# Patient Record
Sex: Male | Born: 1980 | Race: Black or African American | Hispanic: No | Marital: Married | Smoking: Current some day smoker
Health system: Southern US, Community
[De-identification: ages and names within clinical notes are randomized; demographics above are authoritative.]

## PROBLEM LIST (undated history)

## (undated) DIAGNOSIS — I4891 Unspecified atrial fibrillation: Secondary | ICD-10-CM

## (undated) DIAGNOSIS — I10 Essential (primary) hypertension: Secondary | ICD-10-CM

## (undated) DIAGNOSIS — R600 Localized edema: Secondary | ICD-10-CM

## (undated) DIAGNOSIS — E669 Obesity, unspecified: Secondary | ICD-10-CM

## (undated) DIAGNOSIS — E8881 Metabolic syndrome: Secondary | ICD-10-CM

## (undated) DIAGNOSIS — J189 Pneumonia, unspecified organism: Secondary | ICD-10-CM

## (undated) DIAGNOSIS — G43909 Migraine, unspecified, not intractable, without status migrainosus: Secondary | ICD-10-CM

## (undated) DIAGNOSIS — G4730 Sleep apnea, unspecified: Secondary | ICD-10-CM

## (undated) HISTORY — DX: Migraine, unspecified, not intractable, without status migrainosus: G43.909

## (undated) HISTORY — DX: Sleep apnea, unspecified: G47.30

## (undated) HISTORY — DX: Metabolic syndrome: E88.81

## (undated) HISTORY — PX: KNEE SURGERY: SHX244

## (undated) HISTORY — DX: Obesity, unspecified: E66.9

## (undated) HISTORY — DX: Metabolic syndrome: E88.810

## (undated) HISTORY — DX: Localized edema: R60.0

---

## 2015-09-10 ENCOUNTER — Telehealth: Payer: Self-pay | Admitting: Cardiovascular Disease

## 2015-09-10 NOTE — Telephone Encounter (Signed)
Records received from [facility] for appointment with Dr Tresa Endo on 09/12/15.  Records given to [facility] (medical records) for Dr Landry Dyke schedule on 09/12/15. lp

## 2015-09-12 ENCOUNTER — Encounter: Payer: Self-pay | Admitting: Cardiovascular Disease

## 2015-09-12 ENCOUNTER — Ambulatory Visit (INDEPENDENT_AMBULATORY_CARE_PROVIDER_SITE_OTHER): Payer: BLUE CROSS/BLUE SHIELD | Admitting: Cardiovascular Disease

## 2015-09-12 VITALS — BP 132/90 | HR 84 | Ht 74.0 in | Wt >= 6400 oz

## 2015-09-12 DIAGNOSIS — R600 Localized edema: Secondary | ICD-10-CM

## 2015-09-12 DIAGNOSIS — R252 Cramp and spasm: Secondary | ICD-10-CM | POA: Diagnosis not present

## 2015-09-12 DIAGNOSIS — G4730 Sleep apnea, unspecified: Secondary | ICD-10-CM

## 2015-09-12 DIAGNOSIS — Z716 Tobacco abuse counseling: Secondary | ICD-10-CM

## 2015-09-12 DIAGNOSIS — M79606 Pain in leg, unspecified: Secondary | ICD-10-CM

## 2015-09-12 DIAGNOSIS — E8881 Metabolic syndrome: Secondary | ICD-10-CM

## 2015-09-12 DIAGNOSIS — G4733 Obstructive sleep apnea (adult) (pediatric): Secondary | ICD-10-CM

## 2015-09-12 DIAGNOSIS — R0600 Dyspnea, unspecified: Secondary | ICD-10-CM

## 2015-09-12 DIAGNOSIS — R0609 Other forms of dyspnea: Secondary | ICD-10-CM | POA: Diagnosis not present

## 2015-09-12 DIAGNOSIS — Z13220 Encounter for screening for lipoid disorders: Secondary | ICD-10-CM

## 2015-09-12 MED ORDER — FUROSEMIDE 80 MG PO TABS: 80.0000 mg | ORAL_TABLET | Freq: Every day | ORAL | Status: DC

## 2015-09-12 NOTE — Patient Instructions (Signed)
Your physician recommends that you return for lab work fasting.  Your physician has requested that you have a lower or upper extremity venous duplex. This test is an ultrasound of the veins in the legs or arms. It looks at venous blood flow that carries blood from the heart to the legs or arms. Allow one hour for a Lower Venous exam. Allow thirty minutes for an Upper Venous exam. There are no restrictions or special instructions.  Your physician has requested that you have a lower or upper extremity arterial duplex. This test is an ultrasound of the arteries in the legs or arms. It looks at arterial blood flow in the legs and arms. Allow one hour for Lower and Upper Arterial scans. There are no restrictions or special instructions  Your physician has recommended that you have a sleep study. This test records several body functions during sleep, including: brain activity, eye movement, oxygen and carbon dioxide blood levels, heart rate and rhythm, breathing rate and rhythm, the flow of air through your mouth and nose, snoring, body muscle movements, and chest and belly movement.   Your physician has recommended you make the following change in your medication: the furosemide has been increased from 40 mg to 80 mg daily.  Your physician has requested that you have an echocardiogram. Echocardiography is a painless test that uses sound waves to create images of your heart. It provides your doctor with information about the size and shape of your heart and how well your heart's chambers and valves are working. This procedure takes approximately one hour. There are no restrictions for this procedure.  Your physician recommends that you schedule a follow-up appointment in: 4 weeks with Dr Tresa Endo.

## 2015-09-14 ENCOUNTER — Other Ambulatory Visit: Payer: Self-pay | Admitting: Cardiovascular Disease

## 2015-09-14 ENCOUNTER — Encounter: Payer: Self-pay | Admitting: Cardiovascular Disease

## 2015-09-14 DIAGNOSIS — R600 Localized edema: Secondary | ICD-10-CM | POA: Insufficient documentation

## 2015-09-14 DIAGNOSIS — G4730 Sleep apnea, unspecified: Secondary | ICD-10-CM | POA: Insufficient documentation

## 2015-09-14 DIAGNOSIS — Z716 Tobacco abuse counseling: Secondary | ICD-10-CM | POA: Insufficient documentation

## 2015-09-14 DIAGNOSIS — I739 Peripheral vascular disease, unspecified: Secondary | ICD-10-CM

## 2015-09-14 DIAGNOSIS — E8881 Metabolic syndrome: Secondary | ICD-10-CM | POA: Insufficient documentation

## 2015-09-14 NOTE — Progress Notes (Signed)
Patient ID: Scott Clark, male   DOB: 07-08-1981, 35 y.o.   MRN: [id number]     Primary MD:Dr. Rufina Falco  PATIENT PROFILE: Scott Clark is a 35 y.o. male  who is referred through the courtesy of Dr.Tisovic for evaluation of lower extremity edema,  sleep apnea and progressive fatigability.   HPI:  Scott Clark has a history of morbid obesity and admits that over the past 6 or 7 years.  He essentially has progressively deteriorated.  Reportedly over 6 years ago he was diagnosed with severe sleep apnea and had a sleep study done in [place].  He had only very infrequently use CPAP for a very short duration.  He has had significant difficulty with daytime sleepiness, and poor sleep.  He typically goes back to bed between 1 and 2 AM and wakes up at 6 AM.  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  He is averaging only 4-5 hours of sleep per night.  Over the last year he has noticed progressive lower extremity edema.  There is remote history of cellulitis.  He currently smokes one half pack of cigarettes per day.  His weight has gained almost 500 pounds and he has gained 200 pounds since he finished school.  He is referred for cardiology evaluation.  Upon further questioning, he admits to significant shortness of breath with activity.  He is unaware of palpitations.  Is very difficult for him to exercise but he previously had walked and lifted weights.  An Epworth Sleepiness Scale score was calculated in the office today and this endorsed at 18 panel with severe excessive daytime sleepiness.  He has a high chance of dozing while sitting and reading, watching television,, as a passenger in a car for now without break, and lying down to rest in the afternoon when circumstances persist.  There is a moderate chance of dozing when sitting and talking with someone, sitting quietly after lunch and in a car while stopped for a  few minutes.  History reviewed. No pertinent past medical history. past medical history is notable for remote history of cellulitis.    History reviewed. No pertinent past surgical history.  No Known Allergies  Current Outpatient Prescriptions  Medication Sig Dispense Refill  . furosemide (LASIX) 80 MG tablet Take 1 tablet (80 mg total) by mouth daily. 30 tablet 6   No current facility-administered medications for this visit.    Social History   Social History  . Marital Status: Married    Spouse Name: N/A  . Number of Children: N/A  . Years of Education: N/A   Occupational History  . Not on file.   Social History Main Topics  . Smoking status: Current Every Day Smoker  . Smokeless tobacco: Never Used  . Alcohol Use: Not on file  . Drug Use: Not on file  . Sexual Activity: Not on file   Other Topics Concern  . Not on file   Social History Narrative   Social history is notable in that he is married for 2 years.  He has 2 children.  He ultimately graduated at age 45, from [place].  [employer].  He currently is employed as a Database administrator.  He has been smoking 1 pack per day for 10 years.  He drinks mixed drinks at least 5 days per week.  Previously, he had exercise 2  days per week.    History reviewed. No pertinent family history.   Family history is notable in that his mother is alive but has lupus.  Father died at age 32 in a car accident.  He has 2 brothers ages 37 and 109, and no sisters.  His children are ages 85 and 70.  ROS General: Negative; No fevers, chills, or night sweats; positive for super morbid obesity  HEENT: Negative; No changes in vision or hearing, sinus congestion, difficulty swallowing Pulmonary: Negative; No cough, wheezing, shortness of breath, hemoptysis Cardiovascular:  See HPI; positive for significant lower extremity edema , leg discomfort with walking  GI: Negative; No nausea, vomiting, diarrhea, or abdominal pain GU: Negative; No  dysuria, hematuria, or difficulty voiding Musculoskeletal: Negative; no myalgias, joint pain, or weakness Hematologic/Oncologic: Negative; no easy bruising, bleeding Endocrine: Negative; no heat/cold intolerance; no diabetes Neuro: Negative; no changes in balance, headaches Skin: Negative; No rashes or skin lesions Psychiatric: Negative; No behavioral problems, depression Sleep: Positive for severe sleep apnea, currently untreated.;  daytime sleepiness, hypersomnolence, No bruxism, restless legs, hypnogagnic hallucinations Other comprehensive 14 point system review is negative   Physical Exam BP 132/90 mmHg  Pulse 84  Ht  (1.88 m)  Wt 496 lb 8 oz (225.211 kg)  BMI 63.72 kg/m2  Wt Readings from Last 3 Encounters:  09/12/15 496 lb 8 oz (225.211 kg)   General: Alert, oriented, no distress.  Skin: normal turgor, no rashes, warm and dry HEENT: Normocephalic, atraumatic. Pupils equal round and reactive to light; sclera anicteric; extraocular muscles intact; Fundi disks flat.  No hemorrhages or exudates  Nose without nasal septal hypertrophy Mouth/Parynx benign; Mallinpatti scale 4 Neck:   Very thick neck; No JVD, no carotid bruits; normal carotid upstroke Lungs: clear to ausculatation and percussion; no wheezing or rales Chest wall: without tenderness to palpitation Heart: PMI not displaced, RRR, s1 s2 normal, 1/6 systolic murmur, no diastolic murmur, no rubs, gallops, thrills, or heaves Abdomen: Marland Kitchen  Marked central adiposity;soft, nontender; no hepatosplenomehaly, BS+; abdominal aorta nontender and not dilated by palpation. Back: no CVA tenderness Pulses 2+ Musculoskeletal: full range of motion, normal strength, no joint deformities Extremities: 4+ tense edema extending above the knees;  Homan's sign negative  Neurologic: grossly nonfocal; Cranial nerves grossly wnl Psychologic: Normal mood and affect   ECG (independently read by me): Normal sinus rhythm at 85 bpm with small  nondiagnostic inferior Q waves with T-wave inversion in lead 3.  Rightward axis.  Normal intervals   LABS:  No flowsheet data found.   No flowsheet data found.  No flowsheet data found. No results found for: MCV No results found for: TSH No results found for: HGBA1C   BNP No results found for: BNP  ProBNP No results found for: PROBNP   Lipid Panel  No results found for: CHOL, TRIG, HDL, CHOLHDL, VLDL, LDLCALC, LDLDIRECT  RADIOLOGY: No results found.   ASSESSMENT AND PLAN:  [name] a 35 year old African-American male who has super morbid obesity with a body mass index of 63.72 and a body weight of 496 at 6 feet 2 inches tall.  He recently had been started on furosemide 40 mg by Dr. Arvid Right but has not noticed significant increased urination since initiation.  He has severe 4+ tense lower extremity edema, which may also possibly be lymphedema.  I have recommended he increase his Lasix to 80 mg daily.  I am scheduling him for lower extremity arterial as well as lower extremity venous  Doppler studies in light of his leg discomfort with walking and to make certain he does not have chronic DVT and to evaluate probable significant venous insufficiency.  A complete set of blood work will be obtained in the fasting state and with recent mild blood sugar elevation.  I will also check a hemoglobin A1c.  Remotely, he was diagnosed with severe sleep apnea that admits today that he is only use CPAP for short duration.  He has not used this in years.  He has severe excessive daytime sleepiness, most likely is contributed by severe sleep apnea coupled with his inadequate sleep duration.  I discussed with him increased mortality associated with sleep deprivation as well as reduction in cognitive function.  I am scheduling him for another sleep study and a split-night protocol to reinitiate his process and receiving new machine.  I discussed with him the new technology of many of the masks  that are now available and that he may tolerate this much better than it had previously.  I spent a long time with him discussing the adverse consequences of untreated sleep apnea with reference to his cardiovascular health.  I'm scheduling him an echo Doppler study to evaluate systolic and diastolic function as well as valvular architecture and specifically to assess his pulmonary pressure, particular with his sleep apnea.  He seems very committed to make a major lifestyle adjustment.  He would benefit from nutritional consultation, but I did spend time with him discussing dietary adjustments.  We discussed the importance of weight loss , both for its effects on future diabetes mellitus, orthopedic problems, hypertension, and his sleep apnea.  I also discussed the importance of smoking cessation and provided counsel.  I will see him in 4 weeks for reevaluation following the above studies and further recommendations will be made at that time.    Lennette Bihari, MD, [facility] 09/14/2015 5:34 PM

## 2015-09-17 ENCOUNTER — Ambulatory Visit (HOSPITAL_COMMUNITY)
Admission: RE | Admit: 2015-09-17 | Discharge: 2015-09-17 | Disposition: A | Discharge status: Home / Self Care | Payer: BLUE CROSS/BLUE SHIELD | Source: Ambulatory Visit | Attending: Cardiology | Admitting: Cardiology

## 2015-09-17 DIAGNOSIS — I739 Peripheral vascular disease, unspecified: Secondary | ICD-10-CM | POA: Insufficient documentation

## 2015-09-25 ENCOUNTER — Other Ambulatory Visit (HOSPITAL_COMMUNITY): Payer: BLUE CROSS/BLUE SHIELD

## 2015-10-10 ENCOUNTER — Telehealth: Payer: Self-pay | Admitting: *Deleted

## 2015-10-10 NOTE — Telephone Encounter (Signed)
Patient called and notified of results. He voiced verbal understanding. 

## 2015-10-10 NOTE — Telephone Encounter (Signed)
-----   Message from Lennette Biharihomas A Kelly, MD sent at 10/10/2015  8:18 AM EDT ----- Significant cardiac ectopy causing suboptimal waveforms. No evidence of segmental lower extremity arterial disease at rest, bilaterally. Normal ABI's, bilaterally. Normal great toe-brachial indices, bilaterally

## 2015-10-12 ENCOUNTER — Ambulatory Visit (HOSPITAL_COMMUNITY): Payer: BLUE CROSS/BLUE SHIELD | Attending: Cardiovascular Disease

## 2015-10-12 ENCOUNTER — Other Ambulatory Visit: Payer: Self-pay

## 2015-10-12 DIAGNOSIS — R0600 Dyspnea, unspecified: Secondary | ICD-10-CM

## 2015-10-12 DIAGNOSIS — R0609 Other forms of dyspnea: Secondary | ICD-10-CM

## 2015-10-25 ENCOUNTER — Ambulatory Visit: Payer: BLUE CROSS/BLUE SHIELD | Admitting: Cardiovascular Disease

## 2015-10-25 ENCOUNTER — Emergency Department (HOSPITAL_COMMUNITY): Payer: BLUE CROSS/BLUE SHIELD

## 2015-10-25 ENCOUNTER — Encounter (HOSPITAL_COMMUNITY): Payer: Self-pay | Admitting: Emergency Medicine

## 2015-10-25 ENCOUNTER — Emergency Department (HOSPITAL_COMMUNITY)
Admission: EM | Admit: 2015-10-25 | Discharge: 2015-10-25 | Disposition: A | Discharge status: Home / Self Care | Payer: BLUE CROSS/BLUE SHIELD | Attending: Emergency Medicine | Admitting: Emergency Medicine

## 2015-10-25 DIAGNOSIS — F17210 Nicotine dependence, cigarettes, uncomplicated: Secondary | ICD-10-CM | POA: Insufficient documentation

## 2015-10-25 DIAGNOSIS — J189 Pneumonia, unspecified organism: Secondary | ICD-10-CM

## 2015-10-25 DIAGNOSIS — R05 Cough: Secondary | ICD-10-CM | POA: Diagnosis present

## 2015-10-25 MED ORDER — ACETAMINOPHEN 500 MG PO TABS
1000.0000 mg | ORAL_TABLET | Freq: Once | ORAL | Status: AC
  Administered 2015-10-25: 1000 mg via ORAL
  Filled 2015-10-25: qty 2

## 2015-10-25 MED ORDER — AZITHROMYCIN 250 MG PO TABS: 250.0000 mg | ORAL_TABLET | Freq: Every day | ORAL | Status: DC

## 2015-10-25 MED ORDER — ALBUTEROL SULFATE HFA 108 (90 BASE) MCG/ACT IN AERS
2.0000 | INHALATION_SPRAY | RESPIRATORY_TRACT | Status: DC | PRN
  Administered 2015-10-25: 2 via RESPIRATORY_TRACT
  Filled 2015-10-25: qty 6.7

## 2015-10-25 NOTE — Discharge Instructions (Signed)
Take the prescribed medication as directed.  Use albuterol inahler as needed for shortness of breath/wheezing. Follow-up with your primary care physician to ensure infection clears. Return to the ED for new or worsening symptoms.  Community-Acquired Pneumonia, Adult Pneumonia is an infection of the lungs. There are different types of pneumonia. One type can develop while a person is in a hospital. A different type, called community-acquired pneumonia, develops in people who are not, or have not recently been, in the hospital or other health care facility.  CAUSES Pneumonia may be caused by bacteria, viruses, or funguses. Community-acquired pneumonia is often caused by Streptococcus pneumonia bacteria. These bacteria are often passed from one person to another by breathing in droplets from the cough or sneeze of an infected person. RISK FACTORS The condition is more likely to develop in:  People who havechronic diseases, such as chronic obstructive pulmonary disease (COPD), asthma, congestive heart failure, cystic fibrosis, diabetes, or kidney disease.  People who haveearly-stage or late-stage HIV.  People who havesickle cell disease.  People who havehad their spleen removed (splenectomy).  People who havepoor Administratordental hygiene.  People who havemedical conditions that increase the risk of breathing in (aspirating) secretions their own mouth and nose.   People who havea weakened immune system (immunocompromised).  People who smoke.  People whotravel to areas where pneumonia-causing germs commonly exist.  People whoare around animal habitats or animals that have pneumonia-causing germs, including birds, bats, rabbits, cats, and farm animals. SYMPTOMS Symptoms of this condition include:  Adry cough.  A wet (productive) cough.  Fever.  Sweating.  Chest pain, especially when breathing deeply or coughing.  Rapid breathing or difficulty breathing.  Shortness of  breath.  Shaking chills.  Fatigue.  Muscle aches. DIAGNOSIS Your health care provider will take a medical history and perform a physical exam. You may also have other tests, including:  Imaging studies of your chest, including X-rays.  Tests to check your blood oxygen level and other blood gases.  Other tests on blood, mucus (sputum), fluid around your lungs (pleural fluid), and urine. If your pneumonia is severe, other tests may be done to identify the specific cause of your illness. TREATMENT The type of treatment that you receive depends on many factors, such as the cause of your pneumonia, the medicines you take, and other medical conditions that you have. For most adults, treatment and recovery from pneumonia may occur at home. In some cases, treatment must happen in a hospital. Treatment may include:  Antibiotic medicines, if the pneumonia was caused by bacteria.  Antiviral medicines, if the pneumonia was caused by a virus.  Medicines that are given by mouth or through an IV tube.  Oxygen.  Respiratory therapy. Although rare, treating severe pneumonia may include:  Mechanical ventilation. This is done if you are not breathing well on your own and you cannot maintain a safe blood oxygen level.  Thoracentesis. This procedureremoves fluid around one lung or both lungs to help you breathe better. HOME CARE INSTRUCTIONS  Take over-the-counter and prescription medicines only as told by your health care provider.  Only takecough medicine if you are losing sleep. Understand that cough medicine can prevent your body's natural ability to remove mucus from your lungs.  If you were prescribed an antibiotic medicine, take it as told by your health care provider. Do not stop taking the antibiotic even if you start to feel better.  Sleep in a semi-upright position at night. Try sleeping in a reclining chair, or place  a few pillows under your head.  Do not use tobacco products,  including cigarettes, chewing tobacco, and e-cigarettes. If you need help quitting, ask your health care provider.  Drink enough water to keep your urine clear or pale yellow. This will help to thin out mucus secretions in your lungs. PREVENTION There are ways that you can decrease your risk of developing community-acquired pneumonia. Consider getting a pneumococcal vaccine if:  You are older than 33 years of age.  You are older than 33 years of age and are undergoing cancer treatment, have chronic lung disease, or have other medical conditions that affect your immune system. Ask your health care provider if this applies to you. There are different types and schedules of pneumococcal vaccines. Ask your health care provider which vaccination option is best for you. You may also prevent community-acquired pneumonia if you take these actions:  Get an influenza vaccine every year. Ask your health care provider which type of influenza vaccine is best for you.  Go to the dentist on a regular basis.  Wash your hands often. Use hand sanitizer if soap and water are not available. SEEK MEDICAL CARE IF:  You have a fever.  You are losing sleep because you cannot control your cough with cough medicine. SEEK IMMEDIATE MEDICAL CARE IF:  You have worsening shortness of breath.  You have increased chest pain.  Your sickness becomes worse, especially if you are an older adult or have a weakened immune system.  You cough up blood.   This information is not intended to replace advice given to you by your health care provider. Make sure you discuss any questions you have with your health care provider.   Document Released: 07/07/2005 Document Revised: 03/28/2015 Document Reviewed: 11/01/2014 Elsevier Interactive Patient Education [employer]

## 2015-10-25 NOTE — ED Provider Notes (Signed)
CSN: [id number]     Arrival date & time 10/25/15  1508 History   First MD Initiated Contact with Patient 10/25/15 1520     Chief Complaint  Patient presents with  . Cough     (Consider location/radiation/quality/duration/timing/severity/associated sxs/prior Treatment) Patient is a 35 y.o. male presenting with cough. The history is provided by the patient and medical records.  Cough  35 y.o. M with cough and chest congestion x 2-3 days.  He states cough is productive with green/yellow sputum.  States he has had some chest discomfort with coughing and some shortness of breath. Has been running a fever at home, febrile here to 101.95F.  he denies any known sick contacts. No history of COPD. Patient is a current smoker.  History reviewed. No pertinent past medical history. History reviewed. No pertinent past surgical history. No family history on file. Social History  Substance Use Topics  . Smoking status: Current Every Day Smoker -- 1.00 packs/day    Types: Cigarettes  . Smokeless tobacco: Never Used  . Alcohol Use: 0.0 oz/week    0 Standard drinks or equivalent per week    Review of Systems  Respiratory: Positive for cough.   All other systems reviewed and are negative.     Allergies  Review of patient's allergies indicates no known allergies.  Home Medications   Prior to Admission medications   Medication Sig Start Date End Date Taking? Authorizing Provider  furosemide (LASIX) 80 MG tablet Take 1 tablet (80 mg total) by mouth daily. 09/12/15   Lennette Bihari, MD   BP 159/80 mmHg  Pulse 107  Temp(Src) 101.4 F (38.6 C) (Oral)  Resp 22  Ht  (1.88 m)  Wt 222.263 kg  BMI 62.89 kg/m2  SpO2 96%   Physical Exam  Constitutional: He is oriented to person, place, and time. He appears well-developed and well-nourished. No distress.  Morbidly obese  HENT:  Head: Normocephalic and atraumatic.  Mouth/Throat: Oropharynx is clear and moist.  Eyes: Conjunctivae and EOM are  normal. Pupils are equal, round, and reactive to light.  Neck: Normal range of motion. Neck supple.  Cardiovascular: Normal rate, regular rhythm and normal heart sounds.   Pulmonary/Chest: Effort normal. No respiratory distress. He has wheezes.  Mild expiratory wheezes in right upper and middle lung fields, no distress, speaking in full sentences without difficulty  Musculoskeletal: Normal range of motion. He exhibits no edema.  Neurological: He is alert and oriented to person, place, and time.  Skin: Skin is warm and dry. He is not diaphoretic.  Psychiatric: He has a normal mood and affect.  Nursing note and vitals reviewed.   ED Course  Procedures (including critical care time) Labs Review Labs Reviewed - No data to display  Imaging Review Dg Chest 2 View  10/25/2015  CLINICAL DATA:  Cough and blood-tinged sputum.  Fever. EXAM: CHEST  2 VIEW COMPARISON:  None. FINDINGS: Normal heart size. No pleural effusion door edema. Airspace consolidation in the right upper lobe is identified compatible with pneumonia. Left lung is clear. IMPRESSION: 1. Right upper lobe pneumonia. Followup PA and lateral chest X-ray is recommended in 3-4 weeks following trial of antibiotic therapy to ensure resolution and exclude underlying malignancy. Electronically Signed   By: Signa Kell M.D.   On: 10/25/2015 15:34   I have personally reviewed and evaluated these images and lab results as part of my medical decision-making.   EKG Interpretation None      MDM  Final diagnoses:  CAP (community acquired pneumonia)   35 y.o. M here with cough and chest congestion x 2-3 days.  Patient febrile with low grade tachycardia on arrival but non-toxic in appearance.  Does have some expiratory wheezes on right but in no acute distress.  CXR with RUL pneumonia.  Will start on azithromycin.  Also given albuterol inhaler here in ED and instructed on use by RT.  Encouraged FU with PCP to ensure infection clears,  recommended repeat CXR in 3-4 weeks.  Discussed plan with patient, he/she acknowledged understanding and agreed with plan of care.  Return precautions given for new or worsening symptoms.  Garlon HatchetLisa M Dan Scearce, PA-C 10/25/15 1546  Lavera Guiseana Duo Liu, MD 10/26/15 1357

## 2015-10-25 NOTE — ED Notes (Signed)
Pt reports cough and chest congestion x 2 days. Pt also reports intermittent nausea. Denies v/d.

## 2015-10-29 ENCOUNTER — Telehealth: Payer: Self-pay | Admitting: *Deleted

## 2015-10-29 NOTE — Telephone Encounter (Signed)
-----   Message from Lennette Biharihomas A Kelly, MD sent at 10/20/2015  2:29 PM EDT ----- NL EF; mod LVH, AV sclerosis

## 2015-10-29 NOTE — Telephone Encounter (Signed)
Called and informed patient of echo results. Patient cancelled previous appointment to discuss echo and other studies due to having pneumonia. Informed patient we will make follow up appointment pending sleep study results. Patient voiced understanding.

## 2015-11-14 ENCOUNTER — Ambulatory Visit (HOSPITAL_BASED_OUTPATIENT_CLINIC_OR_DEPARTMENT_OTHER): Payer: BLUE CROSS/BLUE SHIELD | Attending: Cardiovascular Disease | Admitting: Cardiovascular Disease

## 2015-11-14 DIAGNOSIS — Z79899 Other long term (current) drug therapy: Secondary | ICD-10-CM | POA: Insufficient documentation

## 2015-11-14 DIAGNOSIS — G4733 Obstructive sleep apnea (adult) (pediatric): Secondary | ICD-10-CM | POA: Diagnosis not present

## 2015-11-22 ENCOUNTER — Encounter (HOSPITAL_BASED_OUTPATIENT_CLINIC_OR_DEPARTMENT_OTHER): Payer: Self-pay | Admitting: Cardiovascular Disease

## 2015-11-22 NOTE — Procedures (Signed)
Patient Name: Scott Clark, Scott Clark Date: 11/14/2015 Gender: Male D.O.B: [birth year] Age (years): 34 Referring Provider: Shelva Majestic MD, ABSM Height (inches): 74 Interpreting Physician: Shelva Majestic MD, ABSM Weight (lbs): 500 RPSGT: Zadie Rhine BMI: 47 MRN: [id number] Neck Size: 19.50  CLINICAL INFORMATION The patient is referred for a split night study with BPAP.  MEDICATIONS azithromycin (ZITHROMAX) 250 MG tablet 250 mg, Daily    furosemide (LASIX) 80 MG tablet   Medications administered by patient during sleep study : No sleep medicine administered.  SLEEP STUDY TECHNIQUE As per the AASM Manual for the Scoring of Sleep and Associated Events v2.3 (April 2016) with a hypopnea requiring 4% desaturations. The channels recorded and monitored were frontal, central and occipital EEG, electrooculogram (EOG), submentalis EMG (chin), nasal and oral airflow, thoracic and abdominal wall motion, anterior tibialis EMG, snore microphone, electrocardiogram, and pulse oximetry. Bi-level positive airway pressure (BiPAP) was initiated when the patient met split night criteria and was titrated according to treat sleep-disordered breathing.  RESPIRATORY PARAMETERS Diagnostic Total AHI (/hr): 120.6 RDI (/hr): 121.0 OA Index (/hr): 103.7 CA Index (/hr): 0.0 REM AHI (/hr): 120.0 NREM AHI (/hr): 121.1 Supine AHI (/hr): 114.2 Non-supine AHI (/hr): 135.00 Min O2 Sat (%): 51.00 Mean O2 (%): 78.55 Time below 88% (min): 111.8   Titration Optimal IPAP Pressure (cm): 24 Optimal EPAP Pressure (cm): 20 AHI at Optimal Pressure (/hr): 0.8 Min O2 at Optimal Pressure (%): 86.0 Sleep % at Optimal (%): 99 Supine % at Optimal (%): 0      SLEEP ARCHITECTURE The study was initiated at 11:57:22 PM and terminated at 6:00:37 AM. The total recorded time was 363.2 minutes. EEG confirmed total sleep time was 341.7 minutes yielding a sleep efficiency of 94.1%. Sleep onset after lights out was 0.0 minutes with a REM  latency of 11.0 minutes. The patient spent 2.34% of the night in stage N1 sleep, 42.43% in stage N2 sleep, 0.00% in stage N3 and 55.23% in REM. Wake after sleep onset (WASO) was 21.5 minutes. The Arousal Index was 50.6/hour.  LEG MOVEMENT DATA The total Periodic Limb Movements of Sleep (PLMS) were 0. The PLMS index was 0.00 .  CARDIAC DATA The 2 lead EKG demonstrated sinus rhythm. The mean heart rate was 87.95 beats per minute. Other EKG findings include: None.  IMPRESSIONS - Very severe obstructive sleep apnea occurred during the diagnostic portion of the study (AHI = 120.6 /hour). - CPAP was initiated and increased to 20 cm; due to continued events Bi-Level was started at 22/19 and was increased to 24/20.  AHI at 24/20 ws 0.8/hr.  - No significant central sleep apnea occurred during the diagnostic portion of the study (CAI = 0.0/hour). - Severe oxygen desaturation  during the diagnostic portion of the study to a nadir of 51.00%. Time spent < 88% was 111.8 minutes - No snoring was audible during this study. - No cardiac abnormalities were noted during this study. - Clinically significant periodic limb movements of sleep did not occur during the study.  DIAGNOSIS - Obstructive Sleep Apnea (327.23 [G47.33 ICD-10])  RECOMMENDATIONS - Recommend an initial trial of BiPAP therapy on 24/20 cm H2O with a Large size Resmed Full Face Mask AirFit F20 mask and heated humidification. - Efforts should be made to optimize nasal and oropharyngeal patency. - Avoid alcohol, sedatives and other CNS depressants that may worsen sleep apnea and disrupt normal sleep architecture. - Sleep hygiene should be reviewed to assess factors that may improve sleep quality. - Weight  management (BMI 64) and regular exercise should be initiated. - Recommend a download be obtained in 30 days and sleep clinic evaluation.   Troy Sine, MD, [place], American Board of Sleep Medicine  ELECTRONICALLY SIGNED ON:   11/22/2015, 6:44 PM [place] PH: [phone]   FX: [phone] [place]

## 2015-12-04 ENCOUNTER — Telehealth: Payer: Self-pay | Admitting: *Deleted

## 2015-12-04 NOTE — Telephone Encounter (Signed)
Left detailed sleep study results and recommendation message on patient's home answering machine. He is to call back if questions and/or concerns. Referral sent to [facility] for BIPAP set up ASAP!

## 2016-01-21 DIAGNOSIS — G4733 Obstructive sleep apnea (adult) (pediatric): Secondary | ICD-10-CM | POA: Diagnosis not present

## 2016-02-21 DIAGNOSIS — G4733 Obstructive sleep apnea (adult) (pediatric): Secondary | ICD-10-CM | POA: Diagnosis not present

## 2016-03-23 DIAGNOSIS — G4733 Obstructive sleep apnea (adult) (pediatric): Secondary | ICD-10-CM | POA: Diagnosis not present

## 2016-03-27 ENCOUNTER — Ambulatory Visit: Payer: BLUE CROSS/BLUE SHIELD | Admitting: Cardiovascular Disease

## 2016-04-22 DIAGNOSIS — G4733 Obstructive sleep apnea (adult) (pediatric): Secondary | ICD-10-CM | POA: Diagnosis not present

## 2016-05-23 DIAGNOSIS — G4733 Obstructive sleep apnea (adult) (pediatric): Secondary | ICD-10-CM | POA: Diagnosis not present

## 2016-05-31 ENCOUNTER — Emergency Department (HOSPITAL_COMMUNITY)
Admission: EM | Admit: 2016-05-31 | Discharge: 2016-05-31 | Disposition: A | Discharge status: Home / Self Care | Payer: BLUE CROSS/BLUE SHIELD | Attending: Emergency Medicine | Admitting: Emergency Medicine

## 2016-05-31 ENCOUNTER — Encounter (HOSPITAL_COMMUNITY): Payer: Self-pay | Admitting: *Deleted

## 2016-05-31 DIAGNOSIS — M7989 Other specified soft tissue disorders: Secondary | ICD-10-CM | POA: Diagnosis present

## 2016-05-31 DIAGNOSIS — F17210 Nicotine dependence, cigarettes, uncomplicated: Secondary | ICD-10-CM | POA: Diagnosis not present

## 2016-05-31 DIAGNOSIS — L03115 Cellulitis of right lower limb: Secondary | ICD-10-CM | POA: Diagnosis not present

## 2016-05-31 DIAGNOSIS — Z79899 Other long term (current) drug therapy: Secondary | ICD-10-CM | POA: Diagnosis not present

## 2016-05-31 DIAGNOSIS — I10 Essential (primary) hypertension: Secondary | ICD-10-CM | POA: Insufficient documentation

## 2016-05-31 HISTORY — DX: Essential (primary) hypertension: I10

## 2016-05-31 LAB — CBC WITH DIFFERENTIAL/PLATELET
BASOS ABS: 0 10*3/uL (ref 0.0–0.1)
Basophils Relative: 0 %
EOS PCT: 1 %
Eosinophils Absolute: 0.1 10*3/uL (ref 0.0–0.7)
HCT: 39.5 % (ref 39.0–52.0)
Hemoglobin: 12.6 g/dL — ABNORMAL LOW (ref 13.0–17.0)
LYMPHS PCT: 9 %
Lymphs Abs: 1 10*3/uL (ref 0.7–4.0)
MCH: 26.9 pg (ref 26.0–34.0)
MCHC: 31.9 g/dL (ref 30.0–36.0)
MCV: 84.4 fL (ref 78.0–100.0)
Monocytes Absolute: 0.3 10*3/uL (ref 0.1–1.0)
Monocytes Relative: 3 %
NEUTROS ABS: 10.7 10*3/uL — AB (ref 1.7–7.7)
NEUTROS PCT: 88 %
PLATELETS: 92 10*3/uL — AB (ref 150–400)
RBC: 4.68 MIL/uL (ref 4.22–5.81)
RDW: 17.9 % — ABNORMAL HIGH (ref 11.5–15.5)
WBC: 12.2 10*3/uL — AB (ref 4.0–10.5)

## 2016-05-31 LAB — BASIC METABOLIC PANEL
ANION GAP: 7 (ref 5–15)
BUN: 14 mg/dL (ref 6–20)
CO2: 27 mmol/L (ref 22–32)
Calcium: 9 mg/dL (ref 8.9–10.3)
Chloride: 103 mmol/L (ref 101–111)
Creatinine, Ser: 1.55 mg/dL — ABNORMAL HIGH (ref 0.61–1.24)
GFR, EST NON AFRICAN AMERICAN: 57 mL/min — AB (ref >60)
Glucose, Bld: 117 mg/dL — ABNORMAL HIGH (ref 65–99)
POTASSIUM: 3.8 mmol/L (ref 3.5–5.1)
SODIUM: 137 mmol/L (ref 135–145)

## 2016-05-31 MED ORDER — DOXYCYCLINE HYCLATE 100 MG PO TABS: 100.0000 mg | ORAL_TABLET | Freq: Two times a day (BID) | ORAL | 0 refills | Status: DC

## 2016-05-31 MED ORDER — DOXYCYCLINE HYCLATE 100 MG PO TABS
100.0000 mg | ORAL_TABLET | Freq: Once | ORAL | Status: AC
  Administered 2016-05-31: 100 mg via ORAL
  Filled 2016-05-31: qty 1

## 2016-05-31 NOTE — ED Notes (Signed)
Pt alert & oriented x4, stable gait. Patient given discharge instructions, paperwork & prescription(s). Patient  instructed to stop at the registration desk to finish any additional paperwork. Patient verbalized understanding. Pt left department w/ no further questions. 

## 2016-05-31 NOTE — ED Notes (Signed)
Right lower leg is larger than the left. Pt states it is normally larger. Pt says pain states at the ankle & goes up to the knee.

## 2016-05-31 NOTE — ED Provider Notes (Signed)
[facility] Provider Note   CSN: [id number] Arrival date & time: 05/31/16  2026     History   Chief Complaint Chief Complaint  Patient presents with  . Leg Swelling  . Rash    HPI Scott Clark is a 35 y.o. male.  HPI  Pt was seen at 2100. Per pt, c/o gradual onset and persistence of constant "red rash" to RLE that started this morning. Has been associated with warmth and increased RLE edema. Pt endorses chronic LE's swelling, but today his RLE is "more swollen than usual" from his knee to his foot. Denies injury, no fever, no focal motor weakness, no tingling/numbness in extremity.   Past Medical History:  Diagnosis Date  . Abdominal obesity-metabolic syndrome type 3   . Bilateral edema of lower extremity    normal ABI's (09/2015)  . Hypertension   . Sleep apnea     Patient Active Problem List   Diagnosis Date Noted  . Severe sleep apnea 09/14/2015  . Morbid obesity (HCC) 09/14/2015  . Bilateral edema of lower extremity 09/14/2015  . Metabolic syndrome 09/14/2015  . Tobacco abuse counseling 09/14/2015    History reviewed. No pertinent surgical history.     Home Medications    Prior to Admission medications   Medication Sig Start Date End Date Taking? Authorizing Provider  azithromycin (ZITHROMAX) 250 MG tablet Take 1 tablet (250 mg total) by mouth daily. Take first 2 tablets together, then 1 every day until finished. 10/25/15   Garlon Hatchet, PA-C  furosemide (LASIX) 80 MG tablet Take 1 tablet (80 mg total) by mouth daily. 09/12/15   Lennette Bihari, MD    Family History History reviewed. No pertinent family history.  Social History Social History  Substance Use Topics  . Smoking status: Current Every Day Smoker    Packs/day: 1.00    Types: Cigarettes  . Smokeless tobacco: Never Used  . Alcohol use 0.0 oz/week     Comment: occ. use     Allergies   Patient has no known allergies.   Review of Systems Review of Systems ROS: Statement: All  systems negative except as marked or noted in the HPI; Constitutional: Negative for fever and chills. ; ; Eyes: Negative for eye pain, redness and discharge. ; ; ENMT: Negative for ear pain, hoarseness, nasal congestion, sinus pressure and sore throat. ; ; Cardiovascular: Negative for chest pain, palpitations, diaphoresis, dyspnea. ; ; Respiratory: Negative for cough, wheezing and stridor. ; ; Gastrointestinal: Negative for nausea, vomiting, diarrhea, abdominal pain, blood in stool, hematemesis, jaundice and rectal bleeding. . ; ; Genitourinary: Negative for dysuria, flank pain and hematuria. ; ; Musculoskeletal: Negative for back pain and neck pain. Negative for swelling and trauma.; ; Skin: +RLE rash, warmth. Negative for pruritus, abrasions, blisters, bruising and skin lesion.; ; Neuro: Negative for headache, lightheadedness and neck stiffness. Negative for weakness, altered level of consciousness, altered mental status, extremity weakness, paresthesias, involuntary movement, seizure and syncope.       Physical Exam Updated Vital Signs BP 146/73 (BP Location: Left Arm)   Pulse 118   Temp 98.2 F (36.8 C) (Oral)   Resp 22   Ht 6\' 2"  (1.88 m)   Wt (!) 475 lb (215.5 kg)   SpO2 95%   BMI 60.99 kg/m   Physical Exam 2105: Physical examination:  Nursing notes reviewed; Vital signs and O2 SAT reviewed;  Constitutional: Well developed, Well nourished, Well hydrated, In no acute distress; Head:  Normocephalic, atraumatic; Eyes:  EOMI, PERRL, No scleral icterus; ENMT: Mouth and pharynx normal, Mucous membranes moist; Neck: Supple, Full range of motion; Cardiovascular: Regular rate and rhythm, No gallop; Respiratory: Breath sounds clear & equal bilaterally, No wheezes.  Speaking full sentences with ease, Normal respiratory effort/excursion; Chest: Nontender, Movement normal; Abdomen: Soft, Nontender, Normal bowel sounds; Genitourinary: No CVA tenderness; Extremities: Pulses normal, No deformity. +4 RLE  edema from knee to foot, +erythema and warmth to proximal anterior tibial area, no soft tissue crepitus, no ecchymosis, no open wounds, +mild tenderness right calf area. +3 LLE edema, no rash..; Neuro: AA&Ox3, Major CN grossly intact.  Speech clear. No gross focal motor or sensory deficits in extremities.; Skin: Color normal, Warm, Dry.    ED Treatments / Results  Labs (all labs ordered are listed, but only abnormal results are displayed)   EKG  EKG Interpretation None       Radiology   Procedures Procedures (including critical care time)  Medications Ordered in ED Medications - No data to display   Initial Impression / Assessment and Plan / ED Course  I have reviewed the triage vital signs and the nursing notes.  Pertinent labs & imaging results that were available during my care of the patient were reviewed by me and considered in my medical decision making (see chart for details).  MDM Reviewed: previous chart, nursing note and vitals Reviewed previous: labs and ultrasound Interpretation: labs   Results for orders placed or performed during the hospital encounter of 05/31/16  Basic metabolic panel  Result Value Ref Range   Sodium 137 135 - 145 mmol/L   Potassium 3.8 3.5 - 5.1 mmol/L   Chloride 103 101 - 111 mmol/L   CO2 27 22 - 32 mmol/L   Glucose, Bld 117 (H) 65 - 99 mg/dL   BUN 14 6 - 20 mg/dL   Creatinine, Ser 1.611.55 (H) 0.61 - 1.24 mg/dL   Calcium 9.0 8.9 - 09.610.3 mg/dL   GFR calc non Af Amer 57 (L) >60 mL/min   GFR calc Af Amer >60 >60 mL/min   Anion gap 7 5 - 15  CBC with Differential  Result Value Ref Range   WBC 12.2 (H) 4.0 - 10.5 K/uL   RBC 4.68 4.22 - 5.81 MIL/uL   Hemoglobin 12.6 (L) 13.0 - 17.0 g/dL   HCT 04.539.5 40.939.0 - 81.152.0 %   MCV 84.4 78.0 - 100.0 fL   MCH 26.9 26.0 - 34.0 pg   MCHC 31.9 30.0 - 36.0 g/dL   RDW 91.417.9 (H) 78.211.5 - 95.615.5 %   Platelets 92 (L) 150 - 400 K/uL   Neutrophils Relative % 88 %   Neutro Abs 10.7 (H) 1.7 - 7.7 K/uL    Lymphocytes Relative 9 %   Lymphs Abs 1.0 0.7 - 4.0 K/uL   Monocytes Relative 3 %   Monocytes Absolute 0.3 0.1 - 1.0 K/uL   Eosinophils Relative 1 %   Eosinophils Absolute 0.1 0.0 - 0.7 K/uL   Basophils Relative 0 %   Basophils Absolute 0.0 0.0 - 0.1 K/uL    2210:  Cr mildly elevated; no old to compare. Will tx abx for RLE cellulitis. For completeness, will obtain RLE Vasc US tomorrow morning; pt and family agrees with plan. Dx and testing d/w pt and family.  Questions answered.  Verb understanding, agreeable to d/c home with outpt f/u.    Final Clinical Impressions(s) / ED Diagnoses   Final diagnoses:  None    New Prescriptions New Prescriptions  No medications on file     Samuel JesterKathleen Jacari Iannello, DO 06/04/16 1038

## 2016-05-31 NOTE — Discharge Instructions (Signed)
Take the prescription as directed. Return to the hospital in the morning to obtain an outpatient ultrasound of your leg.  This ultrasound will check to make sure you do not have a "blood clot" in the veins in your leg.  You will receive the results of your testing shortly after having it completed.  If there is a blood clot in your leg, you will be re-directed to the Emergency Department for further evaluation.  Your renal function test was mildly elevated today. Call your regular medical doctor on Monday to schedule a follow up appointment in the next 2 to 3 days to recheck your right leg rash as well as follow up your blood work.  Return to the Emergency Department immediately if worsening.

## 2016-05-31 NOTE — ED Triage Notes (Signed)
Right LE swelling for few years but has gotten worse today.  Chills starting today

## 2016-05-31 NOTE — ED Notes (Signed)
Pt states right lower leg sore & tender more today than normal. Pt denies any new activity or injury.

## 2016-06-01 ENCOUNTER — Ambulatory Visit (HOSPITAL_COMMUNITY)
Admit: 2016-06-01 | Discharge: 2016-06-01 | Disposition: A | Discharge status: Home / Self Care | Payer: BLUE CROSS/BLUE SHIELD | Attending: Emergency Medicine | Admitting: Emergency Medicine

## 2016-06-01 DIAGNOSIS — R600 Localized edema: Secondary | ICD-10-CM | POA: Diagnosis not present

## 2016-06-01 DIAGNOSIS — M7989 Other specified soft tissue disorders: Secondary | ICD-10-CM | POA: Insufficient documentation

## 2016-06-22 DIAGNOSIS — G4733 Obstructive sleep apnea (adult) (pediatric): Secondary | ICD-10-CM | POA: Diagnosis not present

## 2016-07-23 DIAGNOSIS — G4733 Obstructive sleep apnea (adult) (pediatric): Secondary | ICD-10-CM | POA: Diagnosis not present

## 2016-08-05 DIAGNOSIS — G4733 Obstructive sleep apnea (adult) (pediatric): Secondary | ICD-10-CM | POA: Diagnosis not present

## 2016-08-23 DIAGNOSIS — G4733 Obstructive sleep apnea (adult) (pediatric): Secondary | ICD-10-CM | POA: Diagnosis not present

## 2016-09-20 DIAGNOSIS — G4733 Obstructive sleep apnea (adult) (pediatric): Secondary | ICD-10-CM | POA: Diagnosis not present

## 2016-10-21 DIAGNOSIS — G4733 Obstructive sleep apnea (adult) (pediatric): Secondary | ICD-10-CM | POA: Diagnosis not present

## 2017-02-02 DIAGNOSIS — L600 Ingrowing nail: Secondary | ICD-10-CM | POA: Diagnosis not present

## 2017-02-02 DIAGNOSIS — M79675 Pain in left toe(s): Secondary | ICD-10-CM | POA: Diagnosis not present

## 2017-02-02 DIAGNOSIS — B351 Tinea unguium: Secondary | ICD-10-CM | POA: Diagnosis not present

## 2017-02-16 DIAGNOSIS — L600 Ingrowing nail: Secondary | ICD-10-CM | POA: Diagnosis not present

## 2017-02-16 DIAGNOSIS — M79675 Pain in left toe(s): Secondary | ICD-10-CM | POA: Diagnosis not present

## 2017-03-03 ENCOUNTER — Emergency Department (HOSPITAL_COMMUNITY)
Admission: EM | Admit: 2017-03-03 | Discharge: 2017-03-04 | Disposition: A | Discharge status: Home / Self Care | Payer: BLUE CROSS/BLUE SHIELD | Attending: Emergency Medicine | Admitting: Emergency Medicine

## 2017-03-03 ENCOUNTER — Encounter (HOSPITAL_COMMUNITY): Payer: Self-pay | Admitting: Emergency Medicine

## 2017-03-03 DIAGNOSIS — R600 Localized edema: Secondary | ICD-10-CM | POA: Insufficient documentation

## 2017-03-03 DIAGNOSIS — F17210 Nicotine dependence, cigarettes, uncomplicated: Secondary | ICD-10-CM | POA: Diagnosis not present

## 2017-03-03 DIAGNOSIS — M79661 Pain in right lower leg: Secondary | ICD-10-CM | POA: Diagnosis not present

## 2017-03-03 DIAGNOSIS — I10 Essential (primary) hypertension: Secondary | ICD-10-CM | POA: Insufficient documentation

## 2017-03-03 DIAGNOSIS — L03115 Cellulitis of right lower limb: Secondary | ICD-10-CM | POA: Diagnosis not present

## 2017-03-03 LAB — CBC WITH DIFFERENTIAL/PLATELET
BASOS ABS: 0 10*3/uL (ref 0.0–0.1)
BASOS PCT: 0 %
Eosinophils Absolute: 0.2 10*3/uL (ref 0.0–0.7)
Eosinophils Relative: 3 %
HEMATOCRIT: 35.2 % — AB (ref 39.0–52.0)
HEMOGLOBIN: 11.3 g/dL — AB (ref 13.0–17.0)
Lymphocytes Relative: 36 %
Lymphs Abs: 2.9 10*3/uL (ref 0.7–4.0)
MCH: 27.2 pg (ref 26.0–34.0)
MCHC: 32.1 g/dL (ref 30.0–36.0)
MCV: 84.8 fL (ref 78.0–100.0)
Monocytes Absolute: 0.5 10*3/uL (ref 0.1–1.0)
Monocytes Relative: 6 %
NEUTROS ABS: 4.4 10*3/uL (ref 1.7–7.7)
NEUTROS PCT: 55 %
Platelets: 166 10*3/uL (ref 150–400)
RBC: 4.15 MIL/uL — AB (ref 4.22–5.81)
RDW: 16.5 % — ABNORMAL HIGH (ref 11.5–15.5)
WBC: 8.1 10*3/uL (ref 4.0–10.5)

## 2017-03-03 LAB — BASIC METABOLIC PANEL
ANION GAP: 9 (ref 5–15)
BUN: 12 mg/dL (ref 6–20)
CALCIUM: 8.8 mg/dL — AB (ref 8.9–10.3)
CHLORIDE: 101 mmol/L (ref 101–111)
CO2: 29 mmol/L (ref 22–32)
Creatinine, Ser: 1.33 mg/dL — ABNORMAL HIGH (ref 0.61–1.24)
GFR calc non Af Amer: >60 mL/min (ref >60)
Glucose, Bld: 100 mg/dL — ABNORMAL HIGH (ref 65–99)
POTASSIUM: 3.9 mmol/L (ref 3.5–5.1)
Sodium: 139 mmol/L (ref 135–145)

## 2017-03-03 LAB — BRAIN NATRIURETIC PEPTIDE: B Natriuretic Peptide: 40 pg/mL (ref 0.0–100.0)

## 2017-03-03 NOTE — ED Triage Notes (Signed)
Pt c/o increased leg swelling and pain x 10 days. Pt c/o swelling from hips to feet.

## 2017-03-04 MED ORDER — DOXYCYCLINE HYCLATE 100 MG PO CAPS: 100.0000 mg | ORAL_CAPSULE | Freq: Two times a day (BID) | ORAL | 0 refills | Status: DC

## 2017-03-04 MED ORDER — DOXYCYCLINE HYCLATE 100 MG PO TABS
100.0000 mg | ORAL_TABLET | Freq: Once | ORAL | Status: AC
  Administered 2017-03-04: 100 mg via ORAL
  Filled 2017-03-04: qty 1

## 2017-03-04 NOTE — Discharge Instructions (Signed)
Elevate your leg. Use heat and ice for comfort, but don't get it too hot or too cold or you will damage your skin. Take the antibiotics until gone.  Return to the ED if you get a high fever, vomiting or your legs get worse instead of better. It can take about 48 hours for the antibiotics to kick in.  Dr Fletcher AnonS Luking is on call for the ED tonight. Call his office in the morning to get a follow up appointment to check your legs later this week.

## 2017-03-04 NOTE — ED Provider Notes (Signed)
[facility] Provider Note   CSN: [id number] Arrival date & time: 03/03/17  2056  Time seen 23:40 PM    History   Chief Complaint Chief Complaint  Patient presents with  . Leg Pain    HPI Scott Clark is a 36 y.o. male.  HPI  Patient reports he has chronic swelling of his lower extremities and he gets better or worse depending on what he eats, drinks, or his activity level. However he states last week his legs remain swollen and did not go down. He states he thinks he had fever at night however he did not check his temperature. He states he's been having chills.He denies abdominal swelling but does feel like he has swelling at the thigh area. He denies any worsening of his chronic shortness of breath that he relates to being overweight or having chest pain. Patient was seen in the ED in November for similar and was treated with doxycycline with improvement. He also had an outpatient Doppler ultrasound of his right lower extremity which was negative for DVT. He states he's never had DVT in the past.  Patient states he was seen about a year ago and was told his blood pressure was high. He was put on blood pressure medication which he took for about 2 months however he does not have a primary care doctor so he did not remain on medication. He states he is trying to get a primary care doctor and is working on it and hopefully will have one soon.  PCP none  PCP none  Past Medical History:  Diagnosis Date  . Abdominal obesity-metabolic syndrome type 3   . Bilateral edema of lower extremity    normal ABI's (09/2015)  . Sleep apnea     Patient Active Problem List   Diagnosis Date Noted  . Severe sleep apnea 09/14/2015  . Morbid obesity (HCC) 09/14/2015  . Bilateral edema of lower extremity 09/14/2015  . Metabolic syndrome 09/14/2015  . Tobacco abuse counseling 09/14/2015    History reviewed. No pertinent surgical history.     Home Medications    Prior to Admission  medications   Medication Sig Start Date End Date Taking? Authorizing Provider  doxycycline (VIBRAMYCIN) 100 MG capsule Take 1 capsule (100 mg total) by mouth 2 (two) times daily. 03/04/17   Devoria AlbeKnapp, Desirea Mizrahi, MD  furosemide (LASIX) 80 MG tablet Take 1 tablet (80 mg total) by mouth daily. Patient not taking: Reported on 05/31/2016 09/12/15   Lennette BihariKelly, Thomas A, MD    Family History History reviewed. No pertinent family history.  Social History Social History  Substance Use Topics  . Smoking status: Current Every Day Smoker    Packs/day: 1.00    Types: Cigarettes  . Smokeless tobacco: Never Used  . Alcohol use 0.0 oz/week     Comment: occ. use  employed inspecting roads Live with spouse and children   Allergies   Patient has no known allergies.   Review of Systems Review of Systems  All other systems reviewed and are negative.    Physical Exam Updated Vital Signs BP 135/81 (BP Location: Left Arm)   Pulse 82   Temp 99.5 F (37.5 C)   Resp 19   Ht 6\' 2"  (1.88 m)   Wt (!) 215.5 kg (475 lb)   SpO2 99%   BMI 60.99 kg/m   Vital signs normal    Physical Exam  Constitutional: He is oriented to person, place, and time. He appears well-developed and well-nourished.  Non-toxic appearance. He does not appear ill. No distress.  Morbidly obese  HENT:  Head: Normocephalic and atraumatic.  Right Ear: External ear normal.  Left Ear: External ear normal.  Nose: Nose normal. No mucosal edema or rhinorrhea.  Eyes: Pupils are equal, round, and reactive to light. Conjunctivae and EOM are normal.  Neck: Normal range of motion and full passive range of motion without pain. Neck supple.  Cardiovascular: Normal rate.   Pulmonary/Chest: Effort normal. No respiratory distress. He has no rhonchi. He exhibits no crepitus.  Abdominal: Normal appearance.  Musculoskeletal: He exhibits edema and tenderness.  Patient's right lower extremity is obviously more swollen than the left, he states the right  is always more swollen than the left. However when I palpate the right compared to the left it feels warmer to touch and firm or to palpation. There is no open lesions or obvious redness seen. The skin feels thickened and there is no pitting edema.  Neurological: He is alert and oriented to person, place, and time. He has normal strength. No cranial nerve deficit.  Skin: Skin is warm, dry and intact. No rash noted. No erythema. No pallor.  Psychiatric: He has a normal mood and affect. His speech is normal and behavior is normal. His mood appears not anxious.  Nursing note and vitals reviewed.    ED Treatments / Results  Labs (all labs ordered are listed, but only abnormal results are displayed) Results for orders placed or performed during the hospital encounter of 03/03/17  CBC with Differential  Result Value Ref Range   WBC 8.1 4.0 - 10.5 K/uL   RBC 4.15 (L) 4.22 - 5.81 MIL/uL   Hemoglobin 11.3 (L) 13.0 - 17.0 g/dL   HCT 96.0 (L) 45.4 - 09.8 %   MCV 84.8 78.0 - 100.0 fL   MCH 27.2 26.0 - 34.0 pg   MCHC 32.1 30.0 - 36.0 g/dL   RDW 11.9 (H) 14.7 - 82.9 %   Platelets 166 150 - 400 K/uL   Neutrophils Relative % 55 %   Neutro Abs 4.4 1.7 - 7.7 K/uL   Lymphocytes Relative 36 %   Lymphs Abs 2.9 0.7 - 4.0 K/uL   Monocytes Relative 6 %   Monocytes Absolute 0.5 0.1 - 1.0 K/uL   Eosinophils Relative 3 %   Eosinophils Absolute 0.2 0.0 - 0.7 K/uL   Basophils Relative 0 %   Basophils Absolute 0.0 0.0 - 0.1 K/uL  Basic metabolic panel  Result Value Ref Range   Sodium 139 135 - 145 mmol/L   Potassium 3.9 3.5 - 5.1 mmol/L   Chloride 101 101 - 111 mmol/L   CO2 29 22 - 32 mmol/L   Glucose, Bld 100 (H) 65 - 99 mg/dL   BUN 12 6 - 20 mg/dL   Creatinine, Ser 5.62 (H) 0.61 - 1.24 mg/dL   Calcium 8.8 (L) 8.9 - 10.3 mg/dL   GFR calc non Af Amer >60 >60 mL/min   GFR calc Af Amer >60 >60 mL/min   Anion gap 9 5 - 15  Brain natriuretic peptide  Result Value Ref Range   B Natriuretic Peptide 40.0  0.0 - 100.0 pg/mL   Laboratory interpretation all normal except mild anemia which is slightly worse than what he had before (indices are normal except for elevated RDW), chronic renal insufficiency which is improved from his prior test in November    EKG  EKG Interpretation None       Radiology No results  found.  Procedures Procedures (including critical care time)  Medications Ordered in ED Medications  doxycycline (VIBRA-TABS) tablet 100 mg (not administered)     Initial Impression / Assessment and Plan / ED Course  I have reviewed the triage vital signs and the nursing notes.  Pertinent labs & imaging results that were available during my care of the patient were reviewed by me and considered in my medical decision making (see chart for details).    We had a discussion about how to proceed. Patient had similar symptoms before with a negative Doppler ultrasound. His leg feels warm to touch and is circumferential around the upper portion of his leg. I am not going to repeat his Doppler ultrasound. He describes he feels like he has fever and has had chills and his leg feels warm to touch. He was treated for cellulitis and was started on doxycycline. I offered patient admission to help get his blood pressure controlled and do some screening for his anemia and his renal insufficiency. However patient states he's in the process of looking for a new doctor and prefers to be treated as an outpatient at this point.  Final Clinical Impressions(s) / ED Diagnoses   Final diagnoses:  Cellulitis of right lower extremity    New Prescriptions New Prescriptions   DOXYCYCLINE (VIBRAMYCIN) 100 MG CAPSULE    Take 1 capsule (100 mg total) by mouth 2 (two) times daily.    Plan discharge  Devoria Albe, MD, Concha Pyo, MD 03/04/17 [phone]

## 2017-12-16 ENCOUNTER — Emergency Department (HOSPITAL_COMMUNITY): Payer: BLUE CROSS/BLUE SHIELD

## 2017-12-16 ENCOUNTER — Other Ambulatory Visit: Payer: Self-pay

## 2017-12-16 ENCOUNTER — Inpatient Hospital Stay (HOSPITAL_COMMUNITY)
Admission: EM | Admit: 2017-12-16 | Discharge: 2017-12-18 | DRG: 309 | Disposition: A | Discharge status: Home / Self Care | Payer: BLUE CROSS/BLUE SHIELD | Attending: Internal Medicine | Admitting: Internal Medicine

## 2017-12-16 ENCOUNTER — Encounter (HOSPITAL_COMMUNITY): Payer: Self-pay | Admitting: Emergency Medicine

## 2017-12-16 DIAGNOSIS — I4891 Unspecified atrial fibrillation: Principal | ICD-10-CM | POA: Diagnosis present

## 2017-12-16 DIAGNOSIS — G4733 Obstructive sleep apnea (adult) (pediatric): Secondary | ICD-10-CM | POA: Diagnosis not present

## 2017-12-16 DIAGNOSIS — D696 Thrombocytopenia, unspecified: Secondary | ICD-10-CM | POA: Diagnosis not present

## 2017-12-16 DIAGNOSIS — R000 Tachycardia, unspecified: Secondary | ICD-10-CM | POA: Diagnosis not present

## 2017-12-16 DIAGNOSIS — Z1389 Encounter for screening for other disorder: Secondary | ICD-10-CM | POA: Diagnosis not present

## 2017-12-16 DIAGNOSIS — R0789 Other chest pain: Secondary | ICD-10-CM | POA: Diagnosis not present

## 2017-12-16 DIAGNOSIS — E8881 Metabolic syndrome: Secondary | ICD-10-CM | POA: Diagnosis not present

## 2017-12-16 DIAGNOSIS — G43909 Migraine, unspecified, not intractable, without status migrainosus: Secondary | ICD-10-CM | POA: Diagnosis present

## 2017-12-16 DIAGNOSIS — G4730 Sleep apnea, unspecified: Secondary | ICD-10-CM | POA: Diagnosis present

## 2017-12-16 DIAGNOSIS — Z23 Encounter for immunization: Secondary | ICD-10-CM | POA: Diagnosis not present

## 2017-12-16 DIAGNOSIS — I483 Typical atrial flutter: Secondary | ICD-10-CM | POA: Diagnosis present

## 2017-12-16 DIAGNOSIS — Z0001 Encounter for general adult medical examination with abnormal findings: Secondary | ICD-10-CM | POA: Diagnosis not present

## 2017-12-16 DIAGNOSIS — F17210 Nicotine dependence, cigarettes, uncomplicated: Secondary | ICD-10-CM | POA: Diagnosis present

## 2017-12-16 DIAGNOSIS — R509 Fever, unspecified: Secondary | ICD-10-CM

## 2017-12-16 DIAGNOSIS — R600 Localized edema: Secondary | ICD-10-CM | POA: Diagnosis present

## 2017-12-16 DIAGNOSIS — Z6841 Body Mass Index (BMI) 40.0 and over, adult: Secondary | ICD-10-CM | POA: Diagnosis not present

## 2017-12-16 DIAGNOSIS — R0602 Shortness of breath: Secondary | ICD-10-CM | POA: Diagnosis not present

## 2017-12-16 DIAGNOSIS — R0600 Dyspnea, unspecified: Secondary | ICD-10-CM | POA: Diagnosis not present

## 2017-12-16 LAB — CBC WITH DIFFERENTIAL/PLATELET
Basophils Absolute: 0 10*3/uL (ref 0.0–0.1)
Basophils Relative: 1 %
Eosinophils Absolute: 0.3 10*3/uL (ref 0.0–0.7)
Eosinophils Relative: 5 %
HCT: 40.6 % (ref 39.0–52.0)
Hemoglobin: 13.1 g/dL (ref 13.0–17.0)
Lymphocytes Relative: 43 %
Lymphs Abs: 2.6 10*3/uL (ref 0.7–4.0)
MCH: 27.8 pg (ref 26.0–34.0)
MCHC: 32.3 g/dL (ref 30.0–36.0)
MCV: 86 fL (ref 78.0–100.0)
Monocytes Absolute: 0.3 10*3/uL (ref 0.1–1.0)
Monocytes Relative: 5 %
Neutro Abs: 2.8 10*3/uL (ref 1.7–7.7)
Neutrophils Relative %: 46 %
Platelets: 102 10*3/uL — ABNORMAL LOW (ref 150–400)
RBC: 4.72 MIL/uL (ref 4.22–5.81)
RDW: 17.3 % — ABNORMAL HIGH (ref 11.5–15.5)
WBC: 6 10*3/uL (ref 4.0–10.5)

## 2017-12-16 LAB — BASIC METABOLIC PANEL
Anion gap: 9 (ref 5–15)
BUN: 13 mg/dL (ref 6–20)
CO2: 28 mmol/L (ref 22–32)
Calcium: 8.8 mg/dL — ABNORMAL LOW (ref 8.9–10.3)
Chloride: 106 mmol/L (ref 101–111)
Creatinine, Ser: 1.12 mg/dL (ref 0.61–1.24)
GFR calc Af Amer: >60 mL/min (ref >60)
GFR calc non Af Amer: >60 mL/min (ref >60)
Glucose, Bld: 88 mg/dL (ref 65–99)
Potassium: 3.8 mmol/L (ref 3.5–5.1)
Sodium: 143 mmol/L (ref 135–145)

## 2017-12-16 LAB — URINALYSIS, ROUTINE W REFLEX MICROSCOPIC
Bilirubin Urine: NEGATIVE
Glucose, UA: NEGATIVE mg/dL
Hgb urine dipstick: NEGATIVE
Ketones, ur: NEGATIVE mg/dL
Nitrite: NEGATIVE
Protein, ur: 30 mg/dL — AB
Specific Gravity, Urine: 1.021 (ref 1.005–1.030)
pH: 6 (ref 5.0–8.0)

## 2017-12-16 LAB — TROPONIN I: Troponin I: <0.03 ng/mL (ref <0.03)

## 2017-12-16 LAB — TSH: TSH: 1.667 u[IU]/mL (ref 0.350–4.500)

## 2017-12-16 LAB — MAGNESIUM: Magnesium: 1.8 mg/dL (ref 1.7–2.4)

## 2017-12-16 MED ORDER — SODIUM CHLORIDE 0.9% FLUSH
3.0000 mL | Freq: Two times a day (BID) | INTRAVENOUS | Status: DC
  Administered 2017-12-16 – 2017-12-18 (×3): 3 mL via INTRAVENOUS

## 2017-12-16 MED ORDER — FUROSEMIDE 10 MG/ML IJ SOLN
40.0000 mg | Freq: Every day | INTRAMUSCULAR | Status: DC
  Administered 2017-12-16 – 2017-12-17 (×2): 40 mg via INTRAVENOUS
  Filled 2017-12-16 (×3): qty 4

## 2017-12-16 MED ORDER — DILTIAZEM LOAD VIA INFUSION
10.0000 mg | Freq: Once | INTRAVENOUS | Status: AC
  Administered 2017-12-16: 10 mg via INTRAVENOUS
  Filled 2017-12-16: qty 10

## 2017-12-16 MED ORDER — SODIUM CHLORIDE 0.9% FLUSH: 3.0000 mL | INTRAVENOUS | Status: DC | PRN

## 2017-12-16 MED ORDER — SODIUM CHLORIDE 0.9 % IV SOLN: 250.0000 mL | INTRAVENOUS | Status: DC | PRN

## 2017-12-16 MED ORDER — DILTIAZEM HCL 25 MG/5ML IV SOLN
20.0000 mg | Freq: Once | INTRAVENOUS | Status: AC
Start: 2017-12-16 — End: 2017-12-16
  Administered 2017-12-16: 20 mg via INTRAVENOUS
  Filled 2017-12-16: qty 5

## 2017-12-16 MED ORDER — DILTIAZEM HCL-DEXTROSE 100-5 MG/100ML-% IV SOLN (PREMIX)
5.0000 mg/h | INTRAVENOUS | Status: DC
  Administered 2017-12-16 – 2017-12-17 (×2): 5 mg/h via INTRAVENOUS
  Filled 2017-12-16 (×3): qty 100

## 2017-12-16 MED ORDER — DILTIAZEM HCL 60 MG PO TABS
60.0000 mg | ORAL_TABLET | Freq: Four times a day (QID) | ORAL | Status: DC
  Administered 2017-12-16 – 2017-12-18 (×7): 60 mg via ORAL
  Filled 2017-12-16 (×3): qty 1
  Filled 2017-12-16: qty 2
  Filled 2017-12-16 (×3): qty 1

## 2017-12-16 MED ORDER — ASPIRIN EC 81 MG PO TBEC
81.0000 mg | DELAYED_RELEASE_TABLET | Freq: Every day | ORAL | Status: DC
  Administered 2017-12-16 – 2017-12-18 (×3): 81 mg via ORAL
  Filled 2017-12-16 (×3): qty 1

## 2017-12-16 NOTE — H&P (Signed)
History and Physical    Scott Clark EAV:[id number] DOB: [birth year] DOA: 12/16/2017  PCP: Patient, No Pcp Per  Patient coming from: Home  Chief Complaint: Headaches  HPI: Scott Clark is a 37 y.o. male with medical history significant of obesity, sleep apnea comes in from primary care physician's office for heart rate in the 150s and irregular.  Patient reports no history of heart disease.  He denies any recent illnesses.  He has been short of breath for about a year.  He has been having lower extremity edema and swelling for about 10 years.  He reports no chest pain.  Patient reports that he has gained significant amount of weight year after a year.  Patient found to be in A. fib with RVR was referred for admission for A. fib with RVR.  He has no prior history of stroke and no prior history of hypertension or heart disease.  Review of Systems: As per HPI otherwise 10 point review of systems negative.   Past Medical History:  Diagnosis Date  . Abdominal obesity-metabolic syndrome type 3   . Bilateral edema of lower extremity    normal ABI's (09/2015)  . Sleep apnea     History reviewed. No pertinent surgical history.   reports that he has been smoking cigarettes.  He has been smoking about 1.00 pack per day. He has never used smokeless tobacco. He reports that he drinks alcohol. He reports that he does not use drugs.  No Known Allergies  History reviewed. No pertinent family history.  No premature coronary artery disease  Prior to Admission medications   Medication Sig Start Date End Date Taking? Authorizing Provider  ibuprofen (ADVIL,MOTRIN) 200 MG tablet Take 800 mg by mouth every 6 (six) hours as needed for mild pain or moderate pain.   Yes [provider]    Physical Exam: Vitals:   12/16/17 1751 12/16/17 1752 12/16/17 1933 12/16/17 2000  BP:  (!) 134/110 130/88 (!) 143/109  Pulse:  (!) 50 (!) 117 (!) 104  Resp:  (!) 29 19 (!) 24  Temp:  98.5 F (36.9 C) 98.6  F (37 C)   TempSrc:  Oral Oral   SpO2:  96% 98% 97%  Weight: (!) 238.1 kg (525 lb)     Height:  (1.88 m)         Constitutional: NAD, calm, comfortable Vitals:   12/16/17 1751 12/16/17 1752 12/16/17 1933 12/16/17 2000  BP:  (!) 134/110 130/88 (!) 143/109  Pulse:  (!) 50 (!) 117 (!) 104  Resp:  (!) 29 19 (!) 24  Temp:  98.5 F (36.9 C) 98.6 F (37 C)   TempSrc:  Oral Oral   SpO2:  96% 98% 97%  Weight: (!) 238.1 kg (525 lb)     Height:  (1.88 m)      Eyes: PERRL, lids and conjunctivae normal ENMT: Mucous membranes are moist. Posterior pharynx clear of any exudate or lesions.Normal dentition.  Neck: normal, supple, no masses, no thyromegaly Respiratory: clear to auscultation bilaterally, no wheezing, no crackles. Normal respiratory effort. No accessory muscle use.  Cardiovascular: irRegular rate and rhythm, no murmurs / rubs / gallops.  3+ extremity edema. 2+ pedal pulses. No carotid bruits.  Abdomen: no tenderness, no masses palpated. No hepatosplenomegaly. Bowel sounds positive.  Musculoskeletal: no clubbing / cyanosis. No joint deformity upper and lower extremities. Good ROM, no contractures. Normal muscle tone.  Skin: no rashes, lesions, ulcers. No induration Neurologic: CN 2-12 grossly intact.  Sensation intact, DTR normal. Strength 5/5 in all 4.  Psychiatric: Normal judgment and insight. Alert and oriented x 3. Normal mood.    Labs on Admission: I have personally reviewed following labs and imaging studies  CBC: Recent Labs  Lab 12/16/17 1849  WBC 6.0  NEUTROABS 2.8  HGB 13.1  HCT 40.6  MCV 86.0  PLT 102*   Basic Metabolic Panel: Recent Labs  Lab 12/16/17 1849  NA 143  K 3.8  CL 106  CO2 28  GLUCOSE 88  BUN 13  CREATININE 1.12  CALCIUM 8.8*  MG 1.8   GFR: Estimated Creatinine Clearance: 190.1 mL/min (by C-G formula based on SCr of 1.12 mg/dL). Liver Function Tests: No results for input(s): AST, ALT, ALKPHOS, BILITOT, PROT, ALBUMIN in the  last 168 hours. No results for input(s): LIPASE, AMYLASE in the last 168 hours. No results for input(s): AMMONIA in the last 168 hours. Coagulation Profile: No results for input(s): INR, PROTIME in the last 168 hours. Cardiac Enzymes: Recent Labs  Lab 12/16/17 1849  TROPONINI <0.03   BNP (last 3 results) No results for input(s): PROBNP in the last 8760 hours. HbA1C: No results for input(s): HGBA1C in the last 72 hours. CBG: No results for input(s): GLUCAP in the last 168 hours. Lipid Profile: No results for input(s): CHOL, HDL, LDLCALC, TRIG, CHOLHDL, LDLDIRECT in the last 72 hours. Thyroid Function Tests: No results for input(s): TSH, T4TOTAL, FREET4, T3FREE, THYROIDAB in the last 72 hours. Anemia Panel: No results for input(s): VITAMINB12, FOLATE, FERRITIN, TIBC, IRON, RETICCTPCT in the last 72 hours. Urine analysis:    Component Value Date/Time   COLORURINE YELLOW 12/16/2017 1938   APPEARANCEUR CLEAR 12/16/2017 1938   LABSPEC 1.021 12/16/2017 1938   PHURINE 6.0 12/16/2017 1938   GLUCOSEU NEGATIVE 12/16/2017 1938   HGBUR NEGATIVE 12/16/2017 1938   BILIRUBINUR NEGATIVE 12/16/2017 1938   KETONESUR NEGATIVE 12/16/2017 1938   PROTEINUR 30 (A) 12/16/2017 1938   NITRITE NEGATIVE 12/16/2017 1938   LEUKOCYTESUR SMALL (A) 12/16/2017 1938   Sepsis Labs: !!!!!!!!!!!!!!!!!!!!!!!!!!!!!!!!!!!!!!!!!!!! (procalcitonin:4,lacticidven:4) )No results found for this or any previous visit (from the past 240 hour(s)).   Radiological Exams on Admission: Dg Chest 2 View  Result Date: 12/16/2017 CLINICAL DATA:  Shortness of breath, chest tightness EXAM: CHEST - 2 VIEW COMPARISON:  10/25/2015 FINDINGS: The heart size and mediastinal contours are within normal limits. Both lungs are clear. The visualized skeletal structures are unremarkable. IMPRESSION: No active cardiopulmonary disease. Electronically Signed   By: Elige Ko   On: 12/16/2017 19:06    EKG: Independently reviewed.   A. fib with RVR with a rate of 150 Old chart reviewed Case discussed with EDP Dr. Juleen China Chest x-ray reviewed no edema or infiltrate  Assessment/Plan 37 year old male with new onset A. fib with RVR Principal Problem:   Atrial fibrillation with controlled ventricular rate (HCC)-with possible component of congestive heart failure.  Placed on diltiazem drip and will  orally load will diltiazem 60 mg every 6 hours.  Obtain cardiac echo in the morning.  Placed on aspirin.  Chads score 0.  Check TSH.  Also placed on Lasix 40 mg IV every 24 hours.  Active Problems:   Metabolic syndrome-noted     DVT prophylaxis: SCDs Code Status: Full Family Communication: None Disposition Plan: Per day team Consults called: None Admission status: Observation   DAVID,RACHAL A MD [facility]  If 7PM-7AM, please contact night-coverage [email or web address] Password [email or web address]  12/16/2017, 8:30 PM

## 2017-12-16 NOTE — ED Provider Notes (Signed)
[facility] EMERGENCY DEPARTMENT Provider Note   CSN: [id number] Arrival date & time: 12/16/17  1721     History   Chief Complaint Chief Complaint  Patient presents with  . Irregular Heart Beat    sent by pcp    HPI Scott Clark is a 37 y.o. male.  HPI   37 year old male with atrial fibrillation.  This is incidentally noted on a checkup today.  His significant other convinced him to go get evaluated today for headaches he has been having recently.  He has not been getting routine medical care otherwise.  He does endorse chest tightness and dyspnea with exertion.  He states that he will feel this way when ambulating more than a few minutes at a time.  Improves with rest.  This has been going on for at least the past year though.  He does not feel like his been significantly different in the past few days to weeks.  He doesn't feel palpitations/heart racing.   Past Medical History:  Diagnosis Date  . Abdominal obesity-metabolic syndrome type 3   . Bilateral edema of lower extremity    normal ABI's (09/2015)  . Sleep apnea     Patient Active Problem List   Diagnosis Date Noted  . Severe sleep apnea 09/14/2015  . Morbid obesity (HCC) 09/14/2015  . Bilateral edema of lower extremity 09/14/2015  . Metabolic syndrome 09/14/2015  . Tobacco abuse counseling 09/14/2015    History reviewed. No pertinent surgical history.      Home Medications    Prior to Admission medications   Medication Sig Start Date End Date Taking? Authorizing Provider  ibuprofen (ADVIL,MOTRIN) 200 MG tablet Take 800 mg by mouth every 6 (six) hours as needed for mild pain or moderate pain.   Yes [provider]    Family History History reviewed. No pertinent family history.  Social History Social History   Tobacco Use  . Smoking status: Current Every Day Smoker    Packs/day: 1.00    Types: Cigarettes  . Smokeless tobacco: Never Used  Substance Use Topics  . Alcohol use: Yes   Alcohol/week: 0.0 oz    Comment: occ. use  . Drug use: No     Allergies   Patient has no known allergies.   Review of Systems Review of Systems  All systems reviewed and negative, other than as noted in HPI.  Physical Exam Updated Vital Signs BP (!) 134/110 (BP Location: Right Wrist)   Pulse (!) 50 Comment: very irregular  Temp 98.5 F (36.9 C) (Oral)   Resp (!) 29   Ht  (1.88 m)   Wt (!) 238.1 kg (525 lb)   SpO2 96%   BMI 67.41 kg/m   Physical Exam  Constitutional: He appears well-developed and well-nourished. No distress.  Laying in bed.  No acute distress.  Obese.  HENT:  Head: Normocephalic and atraumatic.  Eyes: Conjunctivae are normal. Right eye exhibits no discharge. Left eye exhibits no discharge.  Neck: Neck supple.  Cardiovascular: Normal heart sounds. Exam reveals no gallop and no friction rub.  No murmur heard. Tachycardic.  Irregularly irregular.  Pulmonary/Chest: Effort normal and breath sounds normal. No respiratory distress.  Abdominal: Soft. He exhibits no distension. There is no tenderness.  Musculoskeletal: He exhibits edema. He exhibits no tenderness.  Neurological: He is alert.  Skin: Skin is warm and dry.  Psychiatric: He has a normal mood and affect. His behavior is normal. Thought content normal.  Nursing note and  vitals reviewed.    ED Treatments / Results  Labs (all labs ordered are listed, but only abnormal results are displayed) Labs Reviewed  BASIC METABOLIC PANEL - Abnormal; Notable for the following components:      Result Value   Calcium 8.8 (*)    All other components within normal limits  CBC WITH DIFFERENTIAL/PLATELET - Abnormal; Notable for the following components:   RDW 17.3 (*)    Platelets 102 (*)    All other components within normal limits  URINALYSIS, ROUTINE W REFLEX MICROSCOPIC - Abnormal; Notable for the following components:   Protein, ur 30 (*)    Leukocytes, UA SMALL (*)    Bacteria, UA RARE (*)     All other components within normal limits  TROPONIN I  MAGNESIUM    EKG EKG Interpretation  Date/Time:  Wednesday Dec 16 2017 18:01:36 EDT Ventricular Rate:  157 PR Interval:    QRS Duration: 110 QT Interval:  339 QTC Calculation: 548 R Axis:   88 Text Interpretation:  atrail fibrillation Nonspecific T abnormalities, diffuse leads Prolonged QT interval Baseline wander in lead(s) V1 No old tracing to compare Confirmed by Raeford Razor [phone]) on 12/16/2017 6:08:02 PM   Radiology Dg Chest 2 View  Result Date: 12/16/2017 CLINICAL DATA:  Shortness of breath, chest tightness EXAM: CHEST - 2 VIEW COMPARISON:  10/25/2015 FINDINGS: The heart size and mediastinal contours are within normal limits. Both lungs are clear. The visualized skeletal structures are unremarkable. IMPRESSION: No active cardiopulmonary disease. Electronically Signed   By: Elige Ko   On: 12/16/2017 19:06    Procedures Procedures (including critical care time)  CRITICAL CARE Performed by: Raeford Razor Total critical care time: 35 minutes Critical care time was exclusive of separately billable procedures and treating other patients. Critical care was necessary to treat or prevent imminent or life-threatening deterioration. Critical care was time spent personally by me on the following activities: development of treatment plan with patient and/or surrogate as well as nursing, discussions with consultants, evaluation of patient's response to treatment, examination of patient, obtaining history from patient or surrogate, ordering and performing treatments and interventions, ordering and review of laboratory studies, ordering and review of radiographic studies, pulse oximetry and re-evaluation of patient's condition.  Medications Ordered in ED Medications  diltiazem (CARDIZEM) injection 20 mg (has no administration in time range)     Initial Impression / Assessment and Plan / ED Course  I have reviewed the triage  vital signs and the nursing notes.  Pertinent labs & imaging results that were available during my care of the patient were reviewed by me and considered in my medical decision making (see chart for details).     37 year old male with new diagnosis of atrial fibrillation.  Unclear chronicity based on his symptoms.  He was actually going in and out of a sinus rhythm in A. fib when I was in the room. He has not had consistent routine medical care.  He likely has a CHADSVASC score of at least 1 for hypertension though. Give a dose of cardizem IV. Didn't convert and rate still predominantly in 120s. Will give additional bolus and start on gtt. Will discuss with medicine for admission.   Final Clinical Impressions(s) / ED Diagnoses   Final diagnoses:  New onset atrial fibrillation [facility])    ED Discharge Orders    None       Raeford Razor, MD 12/16/17 2138

## 2017-12-16 NOTE — ED Triage Notes (Signed)
Pt went to [facility] PCP for check up and was sent here due to irregular hr. Pt denies any complaints. States gets sob and tight to chest over the past year with exertion per pt. Nad at this time

## 2017-12-17 ENCOUNTER — Inpatient Hospital Stay (HOSPITAL_COMMUNITY): Payer: BLUE CROSS/BLUE SHIELD

## 2017-12-17 ENCOUNTER — Encounter (HOSPITAL_COMMUNITY): Payer: Self-pay | Admitting: Cardiology

## 2017-12-17 DIAGNOSIS — G4730 Sleep apnea, unspecified: Secondary | ICD-10-CM | POA: Diagnosis not present

## 2017-12-17 DIAGNOSIS — Z6841 Body Mass Index (BMI) 40.0 and over, adult: Secondary | ICD-10-CM | POA: Diagnosis not present

## 2017-12-17 DIAGNOSIS — E8881 Metabolic syndrome: Secondary | ICD-10-CM | POA: Diagnosis not present

## 2017-12-17 DIAGNOSIS — G4733 Obstructive sleep apnea (adult) (pediatric): Secondary | ICD-10-CM

## 2017-12-17 DIAGNOSIS — I4891 Unspecified atrial fibrillation: Secondary | ICD-10-CM | POA: Diagnosis present

## 2017-12-17 DIAGNOSIS — R600 Localized edema: Secondary | ICD-10-CM | POA: Diagnosis present

## 2017-12-17 DIAGNOSIS — R509 Fever, unspecified: Secondary | ICD-10-CM | POA: Diagnosis not present

## 2017-12-17 DIAGNOSIS — I483 Typical atrial flutter: Secondary | ICD-10-CM

## 2017-12-17 DIAGNOSIS — D696 Thrombocytopenia, unspecified: Secondary | ICD-10-CM | POA: Diagnosis not present

## 2017-12-17 DIAGNOSIS — G43909 Migraine, unspecified, not intractable, without status migrainosus: Secondary | ICD-10-CM | POA: Diagnosis present

## 2017-12-17 DIAGNOSIS — F17210 Nicotine dependence, cigarettes, uncomplicated: Secondary | ICD-10-CM | POA: Diagnosis present

## 2017-12-17 LAB — ECHOCARDIOGRAM COMPLETE
Height: 74 in
Weight: 8400 oz

## 2017-12-17 LAB — BASIC METABOLIC PANEL
ANION GAP: 7 (ref 5–15)
BUN: 14 mg/dL (ref 6–20)
CHLORIDE: 103 mmol/L (ref 101–111)
CO2: 31 mmol/L (ref 22–32)
CREATININE: 1.04 mg/dL (ref 0.61–1.24)
Calcium: 8.6 mg/dL — ABNORMAL LOW (ref 8.9–10.3)
GFR calc non Af Amer: >60 mL/min (ref >60)
Glucose, Bld: 90 mg/dL (ref 65–99)
Potassium: 3.9 mmol/L (ref 3.5–5.1)
Sodium: 141 mmol/L (ref 135–145)

## 2017-12-17 LAB — LIPID PANEL
Cholesterol: 152 mg/dL (ref 0–200)
HDL: 34 mg/dL — ABNORMAL LOW (ref >40)
LDL CALC: 101 mg/dL — AB (ref 0–99)
Total CHOL/HDL Ratio: 4.5 RATIO
Triglycerides: 85 mg/dL (ref <150)
VLDL: 17 mg/dL (ref 0–40)

## 2017-12-17 LAB — CBC
HEMATOCRIT: 41.5 % (ref 39.0–52.0)
HEMOGLOBIN: 12.7 g/dL — AB (ref 13.0–17.0)
MCH: 26.7 pg (ref 26.0–34.0)
MCHC: 30.6 g/dL (ref 30.0–36.0)
MCV: 87.2 fL (ref 78.0–100.0)
Platelets: 85 10*3/uL — ABNORMAL LOW (ref 150–400)
RBC: 4.76 MIL/uL (ref 4.22–5.81)
RDW: 17.6 % — ABNORMAL HIGH (ref 11.5–15.5)
WBC: 4.1 10*3/uL (ref 4.0–10.5)

## 2017-12-17 LAB — MRSA PCR SCREENING: MRSA BY PCR: NEGATIVE

## 2017-12-17 LAB — HEMOGLOBIN A1C
HEMOGLOBIN A1C: 5.4 % (ref 4.8–5.6)
MEAN PLASMA GLUCOSE: 108.28 mg/dL

## 2017-12-17 MED ORDER — OXYCODONE HCL 5 MG PO TABS
5.0000 mg | ORAL_TABLET | Freq: Four times a day (QID) | ORAL | Status: DC | PRN
  Administered 2017-12-17 (×2): 5 mg via ORAL
  Filled 2017-12-17 (×2): qty 1

## 2017-12-17 MED ORDER — KETOROLAC TROMETHAMINE 30 MG/ML IJ SOLN: 30.0000 mg | Freq: Once | INTRAMUSCULAR | Status: DC

## 2017-12-17 MED ORDER — PROMETHAZINE HCL 12.5 MG PO TABS
12.5000 mg | ORAL_TABLET | Freq: Four times a day (QID) | ORAL | Status: DC | PRN
  Administered 2017-12-17: 12.5 mg via ORAL
  Filled 2017-12-17: qty 1

## 2017-12-17 MED ORDER — ONDANSETRON HCL 4 MG/2ML IJ SOLN
4.0000 mg | Freq: Four times a day (QID) | INTRAMUSCULAR | Status: DC | PRN
Start: 2017-12-17 — End: 2017-12-18

## 2017-12-17 NOTE — Care Management Note (Addendum)
Case Management Note  Patient Details  Name: Scott Clark MRN: [id number] Date of Birth: April 25, 1981  Subjective/Objective:   Adm with Afib. From home with wife. Reports independence with ADL's. Recently had appt. To establish primary care with [facility], unsure of which provider. Has transportation and insurance with prescription coverage.                   Action/Plan: DC home with self care. CM following.   ADDENDUM 12/18/2017: Verified with [facility] that patient was seen this week. He is a patient there and was seen by Linford Arnold, PA.   Expected Discharge Date:     unk             Expected Discharge Plan:  Home/Self Care  In-House Referral:     Discharge planning Services  CM Consult  Post Acute Care Choice:  NA Choice offered to:  NA  DME Arranged:    DME Agency:     HH Arranged:    HH Agency:     Status of Service:  Completed, signed off  If discussed at [employer] of Stay Meetings, dates discussed:    Additional Comments:  Michaela Broski, Chrystine Oiler, RN 12/17/2017, 11:13 AM

## 2017-12-17 NOTE — Progress Notes (Signed)
Pt placed on [facility] BIPAP.  BIPAP is plugged into red outlet. Pt wears BIPAP at home with a setting of 24/10. Pt is unable to tolerate that pressure so pt placed on pressure that he can tolerate

## 2017-12-17 NOTE — Progress Notes (Signed)
*  PRELIMINARY RESULTS* Echocardiogram 2D Echocardiogram has been performed.  Stacey Drain 12/17/2017, 3:32 PM

## 2017-12-17 NOTE — Progress Notes (Signed)
PROGRESS NOTE    Scott Clark  VOZ:[id number]  DOB: [birth year]  DOA: 12/16/2017 PCP: Patient, No Pcp Per   Brief Admission Hx: Scott Clark is a 37 y.o. male with medical history significant of obesity, sleep apnea comes in from primary care physician's office for heart rate in the 150s and irregular.  Patient reports no history of heart disease.  He denies any recent illnesses.  He has been short of breath for about a year.  He has been having lower extremity edema and swelling for about 10 years.  He reports no chest pain.  Patient reports that he has gained significant amount of weight year after a year.  Patient found to be in A. fib with RVR was referred for admission for A. fib with RVR.  MDM/Assessment & Plan:   1. Atrial Fibrillation with RVR - cardiology team saw him this morning, his CHADVASC score is 0 and he does not need anticoagulation.  He is being weaned off IV diltiazem and is on oral diltiazem.  If he tolerates this he could likely switch to long acting diltiazem.  2. OSA - He has uncontrolled sleep apnea and needs a new sleep study.  He is not wearing his CPAP consistently.  He also has some symptoms of narcolepsy.  I have recommended that he see a sleep specialist.  3. Chronic LE Edema - He says that this has been ongoing for 5 years but recently worsened.  Echocardiogram ordered. Will follow.   4. Migraine Headaches - He has chronic headaches.  I explained to him that he needs to wear his CPAP consistently.  He may need to follow up with a neurologist once he gets established with a PCP.   5. Metabolic Syndrome X - check A1c, lipid panel, TSH, Vit D 6. Thrombocytopenia - ?reactive, recheck in AM, holding all heparin products.  No bleeding noted.   DVT prophylaxis: SCDs /Ambulation Code Status: Full  Family Communication: wife at bedside  Disposition Plan: continued inpatient medical management needed, ongoing work up in  progress   Consultants:  HeartCare  Procedures:  Echocardiogram   Subjective: Pt denies having any symptoms except for headaches.  Objective: Vitals:   12/17/17 0600 12/17/17 0615 12/17/17 0630 12/17/17 0645  BP: (!) 102/57 113/75 (!) 117/94 (!) 109/94  Pulse: 85 (!) 47 76 88  Resp: (!) 34 (!) 24 (!) 35 (!) 22  Temp:      TempSrc:      SpO2: 100% 100% 100% (!) 81%  Weight:      Height:        Intake/Output Summary (Last 24 hours) at 12/17/2017 1039 Last data filed at 12/17/2017 0702 Gross per 24 hour  Intake 687.42 ml  Output 3000 ml  Net -2312.58 ml   Filed Weights   12/16/17 1751  Weight: (!) 238.1 kg (525 lb)   REVIEW OF SYSTEMS  As per history otherwise all reviewed and reported negative  Exam:  General exam: morbidly obese male, awake, alert, No distress. Cooperative.   Respiratory system:  No increased work of breathing. Cardiovascular system: S1 & S2 heard. 2+ pitting pedal edema bilateral.  Gastrointestinal system: Abdomen is nondistended, soft and nontender. Normal bowel sounds heard. Central nervous system: Alert and oriented. No focal neurological deficits. Extremities: 2++ pitting edema bilateral LEs.  Data Reviewed: Basic Metabolic Panel: Recent Labs  Lab 12/16/17 1849 12/17/17 0518  NA 143 141  K 3.8 3.9  CL 106 103  CO2 28 31  GLUCOSE  88 90  BUN 13 14  CREATININE 1.12 1.04  CALCIUM 8.8* 8.6*  MG 1.8  --    Liver Function Tests: No results for input(s): AST, ALT, ALKPHOS, BILITOT, PROT, ALBUMIN in the last 168 hours. No results for input(s): LIPASE, AMYLASE in the last 168 hours. No results for input(s): AMMONIA in the last 168 hours. CBC: Recent Labs  Lab 12/16/17 1849 12/17/17 0518  WBC 6.0 4.1  NEUTROABS 2.8  --   HGB 13.1 12.7*  HCT 40.6 41.5  MCV 86.0 87.2  PLT 102* 85*   Cardiac Enzymes: Recent Labs  Lab 12/16/17 1849  TROPONINI <0.03   CBG (last 3)  No results for input(s): GLUCAP in the last 72  hours. Recent Results (from the past 240 hour(s))  MRSA PCR Screening     Status: None   Collection Time: 12/16/17 10:34 PM  Result Value Ref Range Status   MRSA by PCR NEGATIVE NEGATIVE Final    Comment:        The GeneXpert MRSA Assay (FDA approved for NASAL specimens only), is one component of a comprehensive MRSA colonization surveillance program. It is not intended to diagnose MRSA infection nor to guide or monitor treatment for MRSA infections. Performed at [facility], [address] [place], [place] [address]      Studies: Dg Chest 2 View  Result Date: 12/16/2017 CLINICAL DATA:  Shortness of breath, chest tightness EXAM: CHEST - 2 VIEW COMPARISON:  10/25/2015 FINDINGS: The heart size and mediastinal contours are within normal limits. Both lungs are clear. The visualized skeletal structures are unremarkable. IMPRESSION: No active cardiopulmonary disease. Electronically Signed   By: Elige Ko   On: 12/16/2017 19:06   Scheduled Meds: . aspirin EC  81 mg Oral Daily  . diltiazem  60 mg Oral Q6H  . furosemide  40 mg Intravenous Daily  . sodium chloride flush  3 mL Intravenous Q12H   Continuous Infusions: . sodium chloride    . diltiazem (CARDIZEM) infusion 5 mg/hr (12/17/17 9147)    Principal Problem:   Atrial fibrillation with controlled ventricular rate (HCC) Active Problems:   Metabolic syndrome  Critical Care Time spent: 34 mins  Standley Dakins, MD, FAAFP [facility] Pager [phone]  If 7PM-7AM, please contact night-coverage [email or web address] Password [email or web address] 12/17/2017, 10:39 AM    LOS: 0 days

## 2017-12-17 NOTE — Consult Note (Signed)
Cardiology Consultation:   Patient ID: Scott Clark; [id number]; [birth year]   Admit date: 12/16/2017 Date of Consult: 12/17/2017  Primary Care Provider: Robbie Lis Primary Cardiologist: Dr. Nicki Guadalajara   Patient Profile:   Scott Clark is a 37 y.o. male with a history of supermorbid obesity and OSA on BiPAP who is being seen today for the evaluation of atrial fibrillation/flutter at the request of Dr. Laural Benes.  History of Present Illness:   Scott Clark is currently admitted to the hospital after being referred to the ER from his PCPs office due to finding of heart rate in the 150s.  Patient tells me that he presented for a routine physical, was not experiencing any significant chest pain, palpitations, or shortness of breath.  He was admitted for management of atrial fibrillation which is newly diagnosed.  CHADSVASC score is 0.  I personally reviewed his ECG from 12/16/2017 which is more consistent with typical atrial flutter in 2:1 block.  He does not report any prior history of cardiac arrhythmia, no palpitations.   Past Medical History:  Diagnosis Date  . Abdominal obesity-metabolic syndrome type 3   . Bilateral edema of lower extremity    Normal ABI's (09/2015)  . Sleep apnea     History reviewed. No pertinent surgical history.   Inpatient Medications: Scheduled Meds: . aspirin EC  81 mg Oral Daily  . diltiazem  60 mg Oral Q6H  . furosemide  40 mg Intravenous Daily  . sodium chloride flush  3 mL Intravenous Q12H   Continuous Infusions: . sodium chloride    . diltiazem (CARDIZEM) infusion 5 mg/hr (12/17/17 0611)   PRN Meds: sodium chloride, sodium chloride flush  Allergies:   No Known Allergies  Social History:   Social History   Socioeconomic History  . Marital status: Married    Spouse name: Not on file  . Number of children: Not on file  . Years of education: Not on file  . Highest education level: Not on file  Occupational History  . Not on file    Social Needs  . Financial resource strain: Not on file  . Food insecurity:    Worry: Not on file    Inability: Not on file  . Transportation needs:    Medical: Not on file    Non-medical: Not on file  Tobacco Use  . Smoking status: Current Every Day Smoker    Packs/day: 1.00    Types: Cigarettes  . Smokeless tobacco: Never Used  Substance and Sexual Activity  . Alcohol use: Yes    Alcohol/week: 0.0 oz    Comment: occ. use  . Drug use: No  . Sexual activity: Not on file  Lifestyle  . Physical activity:    Days per week: Not on file    Minutes per session: Not on file  . Stress: Not on file  Relationships  . Social connections:    Talks on phone: Not on file    Gets together: Not on file    Attends religious service: Not on file    Active member of club or organization: Not on file    Attends meetings of clubs or organizations: Not on file    Relationship status: Not on file  . Intimate partner violence:    Fear of current or ex partner: Not on file    Emotionally abused: Not on file    Physically abused: Not on file    Forced sexual activity: Not on file  Other Topics Concern  .  Not on file  Social History Narrative  . Not on file    Family History:   The patient's family history includes Hypertension in his father.  ROS:  Please see the history of present illness.  Chronic leg swelling.  All other ROS reviewed and negative.     Physical Exam/Data:   Vitals:   12/17/17 0600 12/17/17 0615 12/17/17 0630 12/17/17 0645  BP: (!) 102/57 113/75 (!) 117/94 (!) 109/94  Pulse: 85 (!) 47 76 88  Resp: (!) 34 (!) 24 (!) 35 (!) 22  Temp:      TempSrc:      SpO2: 100% 100% 100% (!) 81%  Weight:      Height:        Intake/Output Summary (Last 24 hours) at 12/17/2017 1019 Last data filed at 12/17/2017 0702 Gross per 24 hour  Intake 687.42 ml  Output 3000 ml  Net -2312.58 ml   Filed Weights   12/16/17 1751  Weight: (!) 525 lb (238.1 kg)   Body mass index is  67.41 kg/m.   Gen: Morbidly obese male in no distress.Marland Kitchen HEENT: Conjunctiva and lids normal, oropharynx clear. Neck: Supple, increased girth, unable to assess JVP,, no obvious thyromegaly. Lungs: Diminished breath sounds without wheezing, nonlabored breathing at rest. Cardiac: Distant regular heart sounds with indistinct PMI, no significant systolic murmur, no pericardial rub. Abdomen: Morbidly obese with pannus, bowel sounds present, no guarding or rebound. Extremities: Chronic appearing leg edema and lymphedema, distal pulses 1-2+. Skin: Warm and dry. Musculoskeletal: No kyphosis. Neuropsychiatric: Alert and oriented x3, affect grossly appropriate.  Telemetry:  I personally reviewed telemetry which shows this rhythm, patient converted earlier this morning.  Relevant CV Studies:  Echocardiogram 10/12/2015: Study Conclusions  - Left ventricle: The cavity size was normal. Wall thickness was   increased in a pattern of moderate LVH. Systolic function was   normal. The estimated ejection fraction was in the range of 55%   to 60%. Wall motion was normal; there were no regional wall   motion abnormalities. - Aortic valve: Mildly to moderately calcified annulus. - Right atrium: The atrium was mildly dilated.  Laboratory Data:  Chemistry Recent Labs  Lab 12/16/17 1849 12/17/17 0518  NA 143 141  K 3.8 3.9  CL 106 103  CO2 28 31  GLUCOSE 88 90  BUN 13 14  CREATININE 1.12 1.04  CALCIUM 8.8* 8.6*  GFRNONAA >60 >60  GFRAA >60 >60  ANIONGAP 9 7    No results for input(s): PROT, ALBUMIN, AST, ALT, ALKPHOS, BILITOT in the last 168 hours. Hematology Recent Labs  Lab 12/16/17 1849 12/17/17 0518  WBC 6.0 4.1  RBC 4.72 4.76  HGB 13.1 12.7*  HCT 40.6 41.5  MCV 86.0 87.2  MCH 27.8 26.7  MCHC 32.3 30.6  RDW 17.3* 17.6*  PLT 102* 85*   Cardiac Enzymes Recent Labs  Lab 12/16/17 1849  TROPONINI <0.03   No results for input(s): TROPIPOC in the last 168 hours.    Radiology/Studies:  Dg Chest 2 View  Result Date: 12/16/2017 CLINICAL DATA:  Shortness of breath, chest tightness EXAM: CHEST - 2 VIEW COMPARISON:  10/25/2015 FINDINGS: The heart size and mediastinal contours are within normal limits. Both lungs are clear. The visualized skeletal structures are unremarkable. IMPRESSION: No active cardiopulmonary disease. Electronically Signed   By: Elige Ko   On: 12/16/2017 19:06    Assessment and Plan:   1.  Newly documented typical atrial flutter with RVR, converted to sinus rhythm  on intravenous diltiazem.  CHADSVASC score is 0.  Patient was not aware of any sense of palpitations, and I suspect that rhythm is paroxysmal particularly in light of significant sleep apnea and supermorbid obesity increasing risk of recurrence.  2.  Obstructive sleep apnea on BiPAP.  Agree with transition from IV to oral Cardizem, if he tolerates this consider changing to Cardizem CD 120 to 180 mg daily for ease of administration.  Follow-up echocardiogram will be obtained to ensure stability in LVEF, previously 55 to 60% as of 2017.  He does not require anticoagulation with low thromboembolic risk score.  Signed, Nona Dell, MD  12/17/2017 10:19 AM

## 2017-12-17 NOTE — Progress Notes (Signed)
Pts oxygen keeps desatting to 70s while he is asleep. Pt states he uses CPAP at home. Dr. Sharl Ma paged to see if he could order CPAP for pt.  Waiting for orders/call back. Will continue to monitor pt

## 2017-12-18 ENCOUNTER — Inpatient Hospital Stay (HOSPITAL_COMMUNITY): Payer: BLUE CROSS/BLUE SHIELD

## 2017-12-18 DIAGNOSIS — G4730 Sleep apnea, unspecified: Secondary | ICD-10-CM

## 2017-12-18 DIAGNOSIS — I4891 Unspecified atrial fibrillation: Principal | ICD-10-CM

## 2017-12-18 DIAGNOSIS — D696 Thrombocytopenia, unspecified: Secondary | ICD-10-CM | POA: Diagnosis present

## 2017-12-18 LAB — CBC
HEMATOCRIT: 41.9 % (ref 39.0–52.0)
HEMOGLOBIN: 12.6 g/dL — AB (ref 13.0–17.0)
MCH: 26.3 pg (ref 26.0–34.0)
MCHC: 30.1 g/dL (ref 30.0–36.0)
MCV: 87.3 fL (ref 78.0–100.0)
Platelets: 76 10*3/uL — ABNORMAL LOW (ref 150–400)
RBC: 4.8 MIL/uL (ref 4.22–5.81)
RDW: 17.4 % — ABNORMAL HIGH (ref 11.5–15.5)
WBC: 4.6 10*3/uL (ref 4.0–10.5)

## 2017-12-18 LAB — COMPREHENSIVE METABOLIC PANEL
ALBUMIN: 3.6 g/dL (ref 3.5–5.0)
ALT: 30 U/L (ref 17–63)
ANION GAP: 7 (ref 5–15)
AST: 26 U/L (ref 15–41)
Alkaline Phosphatase: 65 U/L (ref 38–126)
BUN: 17 mg/dL (ref 6–20)
CO2: 31 mmol/L (ref 22–32)
Calcium: 8.7 mg/dL — ABNORMAL LOW (ref 8.9–10.3)
Chloride: 101 mmol/L (ref 101–111)
Creatinine, Ser: 1.34 mg/dL — ABNORMAL HIGH (ref 0.61–1.24)
GFR calc non Af Amer: >60 mL/min (ref >60)
GLUCOSE: 84 mg/dL (ref 65–99)
POTASSIUM: 3.7 mmol/L (ref 3.5–5.1)
SODIUM: 139 mmol/L (ref 135–145)
TOTAL PROTEIN: 7.4 g/dL (ref 6.5–8.1)
Total Bilirubin: 0.7 mg/dL (ref 0.3–1.2)

## 2017-12-18 LAB — VITAMIN D 25 HYDROXY (VIT D DEFICIENCY, FRACTURES): Vit D, 25-Hydroxy: 7.6 ng/mL — ABNORMAL LOW (ref 30.0–100.0)

## 2017-12-18 MED ORDER — DILTIAZEM HCL ER COATED BEADS 240 MG PO CP24: 240.0000 mg | ORAL_CAPSULE | Freq: Every day | ORAL | Status: DC

## 2017-12-18 MED ORDER — IBUPROFEN 200 MG PO TABS: 800.0000 mg | ORAL_TABLET | Freq: Three times a day (TID) | ORAL | 0 refills | Status: DC | PRN

## 2017-12-18 MED ORDER — DILTIAZEM HCL ER COATED BEADS 240 MG PO CP24
240.0000 mg | ORAL_CAPSULE | Freq: Every day | ORAL | Status: DC
  Administered 2017-12-18: 240 mg via ORAL
  Filled 2017-12-18: qty 1

## 2017-12-18 MED ORDER — ASPIRIN 81 MG PO TBEC: 81.0000 mg | DELAYED_RELEASE_TABLET | Freq: Every day | ORAL | Status: DC

## 2017-12-18 MED ORDER — DILTIAZEM HCL ER COATED BEADS 240 MG PO CP24: 240.0000 mg | ORAL_CAPSULE | Freq: Every day | ORAL | 1 refills | Status: DC

## 2017-12-18 NOTE — Discharge Summary (Signed)
Physician Discharge Summary  Scott Clark ZOX:[id number] DOB: 1980-08-23 DOA: 12/16/2017  PCP: Ellwood Handler, PA-C  Admit date: 12/16/2017 Discharge date: 12/18/2017  Admitted From: Home Disposition: Home  Recommendations for Outpatient Follow-up:  1. Follow up with PCP in 1-2 weeks 2. Please obtain BMP/CBC in one week 3. Consider outpatient hematology referral for further evaluation of chronic thrombocytopenia  Discharge Condition: Stable CODE STATUS: Full code Diet recommendation: Heart Healthy   Brief/Interim Summary: 37 year old male with history of morbid obesity, sleep apnea, was sent to the hospital from his primary care physician's office with a heart rate in the 150s and atrial fibrillation.  Patient was started on a Cardizem infusion.  He was seen by cardiology and was eventually transitioned to oral Cardizem.  Chads vas score was 0 and did not meet criteria for anticoagulation.  He was continued on aspirin.  With diltiazem, his heart rate has improved and is currently stable.  He is been started on long-acting Cardizem.  He is likely been noncompliant with his positive pressure therapy for obstructive sleep apnea.  He has been advised to comply with this since this will likely lead to heart failure amongst other issues.  He does have chronic lower extremity edema, but echocardiogram showed preserved ejection fraction.  I suspect some degree of venous stasis.  He was also noted to have significant thrombocytopenia.  This appears to be a chronic issue.  Would consider outpatient hematology referral for further work-up.  Patient was very insistent on being discharged home.  He should follow-up with his primary care physician in the next 1 to 2 weeks.  Discharge Diagnoses:  Principal Problem:   Atrial fibrillation with controlled ventricular rate (HCC) Active Problems:   Severe sleep apnea   Morbid obesity (HCC)   Metabolic syndrome   Atrial fibrillation with RVR  [facility])    Discharge Instructions  Discharge Instructions    Diet - low sodium heart healthy   Complete by:  As directed    Increase activity slowly   Complete by:  As directed      Allergies as of 12/18/2017   No Known Allergies     Medication List    STOP taking these medications   ibuprofen 200 MG tablet Commonly known as:  ADVIL,MOTRIN     TAKE these medications   aspirin 81 MG EC tablet Take 1 tablet (81 mg total) by mouth daily. Start taking on:  12/19/2017   diltiazem 240 MG 24 hr capsule Commonly known as:  CARDIZEM CD Take 1 capsule (240 mg total) by mouth daily. Start taking on:  12/19/2017      Follow-up Information    Dep[place], Ilean Skill. Schedule an appointment as soon as possible for a visit in 2 week(s).   Specialty:  Family Medicine Contact information: [address] [place] [address] [phone]          No Known Allergies  Consultations:  Cardiology   Procedures/Studies: Dg Chest 2 View  Result Date: 12/18/2017 CLINICAL DATA:  Fever.  Bilateral lower extremity swelling EXAM: CHEST - 2 VIEW COMPARISON:  12/16/2017 FINDINGS: Mildly low lung volumes. There is no edema, consolidation, effusion, or pneumothorax. Normal heart size and mediastinal contours. IMPRESSION: Negative low volume chest. Electronically Signed   By: Marnee Spring M.D.   On: 12/18/2017 13:41   Dg Chest 2 View  Result Date: 12/16/2017 CLINICAL DATA:  Shortness of breath, chest tightness EXAM: CHEST - 2 VIEW COMPARISON:  10/25/2015 FINDINGS: The  heart size and mediastinal contours are within normal limits. Both lungs are clear. The visualized skeletal structures are unremarkable. IMPRESSION: No active cardiopulmonary disease. Electronically Signed   By: Elige Ko   On: 12/16/2017 19:06   Echo:- Left ventricle: The cavity size was mildly dilated. Systolic   function was normal. The estimated ejection fraction was in the   range of 60% to 65%. Wall  motion was normal; there were no   regional wall motion abnormalities. Left ventricular diastolic   function parameters were normal. Mild concentric and moderate   focal basal septal hypertrophy. - Right ventricle: The cavity size was mildly dilated. Wall   thickness was normal.   Subjective: Feeling better.  Denies any shortness of breath.  Says that his current effort and breathing is his baseline.  No chest pain  Discharge Exam: Vitals:   12/18/17 1100 12/18/17 1151  BP:    Pulse: 75   Resp: (!) 22   Temp:  98.4 F (36.9 C)  SpO2: 97%    Vitals:   12/18/17 0832 12/18/17 1000 12/18/17 1100 12/18/17 1151  BP:      Pulse:  81 75   Resp:  (!) 22 (!) 22   Temp: 98.5 F (36.9 C)   98.4 F (36.9 C)  TempSrc: Oral   Oral  SpO2:  95% 97%   Weight:      Height:        General: Pt is alert, awake, morbidly obese, increased respiratory effort Cardiovascular: RRR, S1/S2 +, no rubs, no gallops Respiratory: CTA bilaterally, no wheezing, no rhonchi Abdominal: Soft, NT, ND, bowel sounds + Extremities: 1+ edema, no cyanosis    The results of significant diagnostics from this hospitalization (including imaging, microbiology, ancillary and laboratory) are listed below for reference.     Microbiology: Recent Results (from the past 240 hour(s))  MRSA PCR Screening     Status: None   Collection Time: 12/16/17 10:34 PM  Result Value Ref Range Status   MRSA by PCR NEGATIVE NEGATIVE Final    Comment:        The GeneXpert MRSA Assay (FDA approved for NASAL specimens only), is one component of a comprehensive MRSA colonization surveillance program. It is not intended to diagnose MRSA infection nor to guide or monitor treatment for MRSA infections. Performed at [facility], [address], [place], [place] [address]      Labs: BNP (last 3 results) Recent Labs    03/03/17 2257  BNP 40.0   Basic Metabolic Panel: Recent Labs  Lab 12/16/17 1849 12/17/17 0518  12/18/17 0347  NA 143 141 139  K 3.8 3.9 3.7  CL 106 103 101  CO2 28 31 31   GLUCOSE 88 90 84  BUN 13 14 17   CREATININE 1.12 1.04 1.34*  CALCIUM 8.8* 8.6* 8.7*  MG 1.8  --   --    Liver Function Tests: Recent Labs  Lab 12/18/17 0347  AST 26  ALT 30  ALKPHOS 65  BILITOT 0.7  PROT 7.4  ALBUMIN 3.6   No results for input(s): LIPASE, AMYLASE in the last 168 hours. No results for input(s): AMMONIA in the last 168 hours. CBC: Recent Labs  Lab 12/16/17 1849 12/17/17 0518 12/18/17 0347  WBC 6.0 4.1 4.6  NEUTROABS 2.8  --   --   HGB 13.1 12.7* 12.6*  HCT 40.6 41.5 41.9  MCV 86.0 87.2 87.3  PLT 102* 85* 76*   Cardiac Enzymes: Recent Labs  Lab 12/16/17  1849  TROPONINI <0.03   BNP: Invalid input(s): POCBNP CBG: No results for input(s): GLUCAP in the last 168 hours. D-Dimer No results for input(s): DDIMER in the last 72 hours. Hgb A1c Recent Labs    12/17/17 0518  HGBA1C 5.4   Lipid Profile Recent Labs    12/17/17 0518  CHOL 152  HDL 34*  LDLCALC 101*  TRIG 85  CHOLHDL 4.5   Thyroid function studies Recent Labs    12/16/17 2034  TSH 1.667   Anemia work up No results for input(s): VITAMINB12, FOLATE, FERRITIN, TIBC, IRON, RETICCTPCT in the last 72 hours. Urinalysis    Component Value Date/Time   COLORURINE YELLOW 12/16/2017 1938   APPEARANCEUR CLEAR 12/16/2017 1938   LABSPEC 1.021 12/16/2017 1938   PHURINE 6.0 12/16/2017 1938   GLUCOSEU NEGATIVE 12/16/2017 1938   HGBUR NEGATIVE 12/16/2017 1938   BILIRUBINUR NEGATIVE 12/16/2017 1938   KETONESUR NEGATIVE 12/16/2017 1938   PROTEINUR 30 (A) 12/16/2017 1938   NITRITE NEGATIVE 12/16/2017 1938   LEUKOCYTESUR SMALL (A) 12/16/2017 1938   Sepsis Labs Invalid input(s): PROCALCITONIN,  WBC,  LACTICIDVEN Microbiology Recent Results (from the past 240 hour(s))  MRSA PCR Screening     Status: None   Collection Time: 12/16/17 10:34 PM  Result Value Ref Range Status   MRSA by PCR NEGATIVE NEGATIVE Final     Comment:        The GeneXpert MRSA Assay (FDA approved for NASAL specimens only), is one component of a comprehensive MRSA colonization surveillance program. It is not intended to diagnose MRSA infection nor to guide or monitor treatment for MRSA infections. Performed at [facility], [address] [place], [place] [address]      Time coordinating discharge: 35 minutes  SIGNED:   Erick BlinksJehanzeb Posey Jasmin, MD  [facility] 12/18/2017, 7:40 PM Pager   If 7PM-7AM, please contact night-coverage [email or web address] Password [email or web address]

## 2017-12-18 NOTE — Progress Notes (Signed)
Progress Note  Patient Name: Scott Clark Date of Encounter: 12/18/2017  Primary Cardiologist: Dr. Nicki Guadalajara  Subjective   No chest pain or palpitations.  Inpatient Medications    Scheduled Meds: . aspirin EC  81 mg Oral Daily  . diltiazem  60 mg Oral Q6H  . ketorolac  30 mg Intravenous Once  . sodium chloride flush  3 mL Intravenous Q12H   Continuous Infusions: . sodium chloride    . diltiazem (CARDIZEM) infusion Stopped (12/18/17 0500)   PRN Meds: sodium chloride, ondansetron (ZOFRAN) IV, oxyCODONE, promethazine, sodium chloride flush   Vital Signs    Vitals:   12/18/17 0500 12/18/17 0601 12/18/17 0700 12/18/17 0832  BP: (!) 157/100 (!) 130/108 133/71   Pulse: 61  (!) 59   Resp: (!) 25  (!) 26   Temp:    98.5 F (36.9 C)  TempSrc:    Oral  SpO2: (!) 87%  91%   Weight:      Height:        Intake/Output Summary (Last 24 hours) at 12/18/2017 0902 Last data filed at 12/17/2017 1700 Gross per 24 hour  Intake -  Output 300 ml  Net -300 ml   Filed Weights   12/16/17 1751  Weight: (!) 525 lb (238.1 kg)    Telemetry    Sinus rhythm with PACs.  Personally reviewed.  Physical Exam   GEN:  Morbidly obese.  No acute distress.   Neck: No JVD. Cardiac:  Distant RRR, no gallop.  Respiratory:  Decreased breath sounds throughout without active wheezing. GI:  Morbidly obese with pannus, bowel sounds present. MS:  Chronic appearing edema and lymphedema of both legs. Neuro:  Nonfocal. Psych: Alert and oriented x 3. Normal affect.  Labs    Chemistry Recent Labs  Lab 12/16/17 1849 12/17/17 0518 12/18/17 0347  NA 143 141 139  K 3.8 3.9 3.7  CL 106 103 101  CO2 GLUCOSE 88 90 84  BUN CREATININE 1.12 1.04 4.69*  CALCIUM 8.8* 8.6* 8.7*  PROT  --   --  7.4  ALBUMIN  --   --  3.6  AST  --   --  26  ALT  --   --  30  ALKPHOS  --   --  65  BILITOT  --   --  0.7  GFRNONAA >60 >60 >60  GFRAA >60 >60 >60  ANIONGAP Hematology Recent Labs  Lab 12/16/17 1849 12/17/17 0518 12/18/17 0347  WBC 6.0 4.1 4.6  RBC 4.72 4.76 4.80  HGB 13.1 12.7* 12.6*  HCT 40.6 41.5 41.9  MCV 86.0 87.2 87.3  MCH 27.8 26.7 26.3  MCHC 32.3 30.6 30.1  RDW 17.3* 17.6* 17.4*  PLT 102* 85* 76*    Cardiac Enzymes Recent Labs  Lab 12/16/17 1849  TROPONINI <0.03   No results for input(s): TROPIPOC in the last 168 hours.   Radiology    Dg Chest 2 View  Result Date: 12/16/2017 CLINICAL DATA:  Shortness of breath, chest tightness EXAM: CHEST - 2 VIEW COMPARISON:  10/25/2015 FINDINGS: The heart size and mediastinal contours are within normal limits. Both lungs are clear. The visualized skeletal structures are unremarkable. IMPRESSION: No active cardiopulmonary disease. Electronically Signed   By: Elige Ko   On: 12/16/2017 19:06    Cardiac Studies   Echocardiogram 12/17/2017: Study Conclusions  - Left ventricle: The cavity size was  mildly dilated. Systolic   function was normal. The estimated ejection fraction was in the   range of 60% to 65%. Wall motion was normal; there were no   regional wall motion abnormalities. Left ventricular diastolic   function parameters were normal. Mild concentric and moderate   focal basal septal hypertrophy. - Right ventricle: The cavity size was mildly dilated. Wall   thickness was normal.  Patient Profile     37 y.o. male with a history of morbid obesity and OSA on BiPAP, recently documented to have transient typical atrial flutter with CHADSVASC score of 0.  Assessment & Plan    1.  Newly documented typical atrial flutter, initially with RVR and subsequent spontaneous conversion to sinus rhythm on IV diltiazem.  CHADSVASC score is 0.  Echocardiogram shows LVEF 60 to 65% with mildly dilated right ventricle.  2.  Morbid obesity.  3.  Obstructive sleep apnea on BiPAP.  No need for anticoagulation at this time in light of low thromboembolic risk score.  He is currently on  diltiazem 60 mg p.o. every 6 hours, would suggest converting to Cardizem CD 240 mg daily for ease of use.  No additional cardiac testing is planned at this time.  He does have a fairly high likelihood of recurring atrial arrhythmias in light of morbid obesity with OSA and hypoxic respiratory failure.  Current body habitus would limit interventional procedures such as ablation.  Signed, Nona Dell, MD  12/18/2017, 9:02 AM

## 2017-12-18 NOTE — Progress Notes (Signed)
Pt doesn't want to wear BIPAP at this time

## 2017-12-18 NOTE — Progress Notes (Signed)
Pt on [facility] BIPAP. BIPAP plugged into red outlet. 4L O2 inline

## 2017-12-18 NOTE — Progress Notes (Signed)
Offered to placed BIPAP for sleep but pt is out of bed on the phone. Pt will call when ready for bed

## 2017-12-23 DIAGNOSIS — R0609 Other forms of dyspnea: Secondary | ICD-10-CM | POA: Diagnosis not present

## 2017-12-23 DIAGNOSIS — R079 Chest pain, unspecified: Secondary | ICD-10-CM | POA: Diagnosis not present

## 2017-12-23 DIAGNOSIS — Z1389 Encounter for screening for other disorder: Secondary | ICD-10-CM | POA: Diagnosis not present

## 2017-12-23 DIAGNOSIS — Z6841 Body Mass Index (BMI) 40.0 and over, adult: Secondary | ICD-10-CM | POA: Diagnosis not present

## 2017-12-23 DIAGNOSIS — G4733 Obstructive sleep apnea (adult) (pediatric): Secondary | ICD-10-CM | POA: Diagnosis not present

## 2017-12-23 DIAGNOSIS — I4891 Unspecified atrial fibrillation: Secondary | ICD-10-CM | POA: Diagnosis not present

## 2018-03-24 DIAGNOSIS — F17290 Nicotine dependence, other tobacco product, uncomplicated: Secondary | ICD-10-CM | POA: Diagnosis not present

## 2018-03-24 DIAGNOSIS — G4733 Obstructive sleep apnea (adult) (pediatric): Secondary | ICD-10-CM | POA: Diagnosis not present

## 2018-03-24 DIAGNOSIS — Z6841 Body Mass Index (BMI) 40.0 and over, adult: Secondary | ICD-10-CM | POA: Diagnosis not present

## 2018-03-24 DIAGNOSIS — I4891 Unspecified atrial fibrillation: Secondary | ICD-10-CM | POA: Diagnosis not present

## 2018-03-24 DIAGNOSIS — G43909 Migraine, unspecified, not intractable, without status migrainosus: Secondary | ICD-10-CM | POA: Diagnosis not present

## 2019-04-25 ENCOUNTER — Other Ambulatory Visit: Payer: Self-pay

## 2019-04-25 ENCOUNTER — Other Ambulatory Visit (HOSPITAL_COMMUNITY): Payer: Self-pay | Admitting: Family Medicine

## 2019-04-25 ENCOUNTER — Other Ambulatory Visit: Payer: Self-pay | Admitting: Family Medicine

## 2019-04-25 ENCOUNTER — Ambulatory Visit (HOSPITAL_COMMUNITY)
Admission: RE | Admit: 2019-04-25 | Discharge: 2019-04-25 | Disposition: A | Discharge status: Home / Self Care | Payer: Self-pay | Source: Ambulatory Visit | Attending: Family Medicine | Admitting: Family Medicine

## 2019-04-25 DIAGNOSIS — M7989 Other specified soft tissue disorders: Secondary | ICD-10-CM | POA: Insufficient documentation

## 2019-04-25 DIAGNOSIS — L03115 Cellulitis of right lower limb: Secondary | ICD-10-CM | POA: Insufficient documentation

## 2019-10-03 DIAGNOSIS — Z719 Counseling, unspecified: Secondary | ICD-10-CM | POA: Diagnosis not present

## 2019-10-03 DIAGNOSIS — Z6841 Body Mass Index (BMI) 40.0 and over, adult: Secondary | ICD-10-CM | POA: Diagnosis not present

## 2019-10-04 DIAGNOSIS — M79671 Pain in right foot: Secondary | ICD-10-CM | POA: Diagnosis not present

## 2019-10-04 DIAGNOSIS — M79674 Pain in right toe(s): Secondary | ICD-10-CM | POA: Diagnosis not present

## 2019-10-04 DIAGNOSIS — L600 Ingrowing nail: Secondary | ICD-10-CM | POA: Diagnosis not present

## 2019-10-04 DIAGNOSIS — L03031 Cellulitis of right toe: Secondary | ICD-10-CM | POA: Diagnosis not present

## 2020-04-27 ENCOUNTER — Other Ambulatory Visit: Payer: Self-pay

## 2020-04-27 ENCOUNTER — Emergency Department (HOSPITAL_COMMUNITY)
Admission: EM | Admit: 2020-04-27 | Discharge: 2020-04-27 | Discharge status: Against Medical Advice | Payer: PRIVATE HEALTH INSURANCE | Attending: Emergency Medicine | Admitting: Emergency Medicine

## 2020-04-27 ENCOUNTER — Encounter (HOSPITAL_COMMUNITY): Payer: Self-pay

## 2020-04-27 ENCOUNTER — Emergency Department (HOSPITAL_COMMUNITY): Payer: PRIVATE HEALTH INSURANCE

## 2020-04-27 DIAGNOSIS — Z7982 Long term (current) use of aspirin: Secondary | ICD-10-CM | POA: Insufficient documentation

## 2020-04-27 DIAGNOSIS — M546 Pain in thoracic spine: Secondary | ICD-10-CM | POA: Diagnosis not present

## 2020-04-27 DIAGNOSIS — M25512 Pain in left shoulder: Secondary | ICD-10-CM | POA: Diagnosis present

## 2020-04-27 DIAGNOSIS — D696 Thrombocytopenia, unspecified: Secondary | ICD-10-CM

## 2020-04-27 DIAGNOSIS — R600 Localized edema: Secondary | ICD-10-CM | POA: Insufficient documentation

## 2020-04-27 DIAGNOSIS — R0781 Pleurodynia: Secondary | ICD-10-CM

## 2020-04-27 DIAGNOSIS — Z87891 Personal history of nicotine dependence: Secondary | ICD-10-CM | POA: Insufficient documentation

## 2020-04-27 LAB — BASIC METABOLIC PANEL
Anion gap: 9 (ref 5–15)
BUN: 14 mg/dL (ref 6–20)
CO2: 26 mmol/L (ref 22–32)
Calcium: 9.1 mg/dL (ref 8.9–10.3)
Chloride: 104 mmol/L (ref 98–111)
Creatinine, Ser: 1.19 mg/dL (ref 0.61–1.24)
GFR calc non Af Amer: >60 mL/min (ref >60)
Glucose, Bld: 76 mg/dL (ref 70–99)
Potassium: 4 mmol/L (ref 3.5–5.1)
Sodium: 139 mmol/L (ref 135–145)

## 2020-04-27 LAB — CBC WITH DIFFERENTIAL/PLATELET
Abs Immature Granulocytes: 0.01 10*3/uL (ref 0.00–0.07)
Basophils Absolute: 0.1 10*3/uL (ref 0.0–0.1)
Basophils Relative: 1 %
Eosinophils Absolute: 0.3 10*3/uL (ref 0.0–0.5)
Eosinophils Relative: 6 %
HCT: 41.9 % (ref 39.0–52.0)
Hemoglobin: 13.5 g/dL (ref 13.0–17.0)
Immature Granulocytes: 0 %
Lymphocytes Relative: 35 %
Lymphs Abs: 1.9 10*3/uL (ref 0.7–4.0)
MCH: 28.4 pg (ref 26.0–34.0)
MCHC: 32.2 g/dL (ref 30.0–36.0)
MCV: 88.2 fL (ref 80.0–100.0)
Monocytes Absolute: 0.5 10*3/uL (ref 0.1–1.0)
Monocytes Relative: 9 %
Neutro Abs: 2.7 10*3/uL (ref 1.7–7.7)
Neutrophils Relative %: 49 %
Platelets: 97 10*3/uL — ABNORMAL LOW (ref 150–400)
RBC: 4.75 MIL/uL (ref 4.22–5.81)
RDW: 15.4 % (ref 11.5–15.5)
WBC: 5.4 10*3/uL (ref 4.0–10.5)
nRBC: 0 % (ref 0.0–0.2)

## 2020-04-27 LAB — D-DIMER, QUANTITATIVE (NOT AT ARMC): D-Dimer, Quant: 0.54 ug/mL-FEU — ABNORMAL HIGH (ref 0.00–0.50)

## 2020-04-27 MED ORDER — OXYCODONE-ACETAMINOPHEN 5-325 MG PO TABS: 1.0000 | ORAL_TABLET | ORAL | 0 refills | Status: DC | PRN

## 2020-04-27 MED ORDER — ENOXAPARIN SODIUM 300 MG/3ML IJ SOLN
360.0000 mg | Freq: Once | INTRAMUSCULAR | Status: AC
  Administered 2020-04-27: 360 mg via SUBCUTANEOUS
  Filled 2020-04-27: qty 3.6

## 2020-04-27 MED ORDER — HYDROCODONE-ACETAMINOPHEN 5-325 MG PO TABS
1.0000 | ORAL_TABLET | Freq: Once | ORAL | Status: AC
  Administered 2020-04-27: 1 via ORAL
  Filled 2020-04-27: qty 1

## 2020-04-27 NOTE — ED Provider Notes (Addendum)
[facility] EMERGENCY DEPARTMENT Provider Note   CSN: [id number] Arrival date & time: 04/27/20  4098     History Chief Complaint  Patient presents with  . Shoulder Pain    Scott Clark is a 39 y.o. male with a history of atrial fibrillation on Xaralto, chronic lower extremity edema, sleep apnea, recently stopped smoking, presenting with a 2 day history of localized left posterior back pain just inferior to his left scapula, described as a sharp pain triggered by positional changes of his left shoulder and with deep inspiration.  He denies any new shortness of breath but endorses chronic sob which he blames on weight issues.  He has had no injury to his shoulder or back. He also denies fevers, chills, cough, no chest pain, n/v or diaphoresis.  He has taken ibuprofen and a prescription pain medicine, unsure of name with some improvement in his symptoms.    HPI     Past Medical History:  Diagnosis Date  . Abdominal obesity-metabolic syndrome type 3   . Bilateral edema of lower extremity    Normal ABI's (09/2015)  . Sleep apnea     Patient Active Problem List   Diagnosis Date Noted  . Thrombocytopenia (HCC) 12/18/2017  . Atrial fibrillation with RVR (HCC) 12/17/2017  . Atrial fibrillation with controlled ventricular rate (HCC) 12/16/2017  . Severe sleep apnea 09/14/2015  . Morbid obesity (HCC) 09/14/2015  . Bilateral edema of lower extremity 09/14/2015  . Metabolic syndrome 09/14/2015  . Tobacco abuse counseling 09/14/2015    History reviewed. No pertinent surgical history.     Family History  Problem Relation Age of Onset  . Hypertension Father     Social History   Tobacco Use  . Smoking status: Former Smoker    Packs/day: 1.00    Types: Cigarettes  . Smokeless tobacco: Never Used  Vaping Use  . Vaping Use: Every day  Substance Use Topics  . Alcohol use: Yes    Alcohol/week: 0.0 standard drinks    Comment: occ. use  . Drug use: No    Home  Medications Prior to Admission medications   Medication Sig Start Date End Date Taking? Authorizing Provider  diltiazem (CARDIZEM CD) 240 MG 24 hr capsule Take 1 capsule (240 mg total) by mouth daily. 12/19/17  Yes Erick Blinks, MD  metoprolol tartrate (LOPRESSOR) 100 MG tablet Take 50 mg by mouth 2 (two) times daily. 04/07/20  Yes [provider]  aspirin EC 81 MG EC tablet Take 1 tablet (81 mg total) by mouth daily. Patient not taking: Reported on 04/27/2020 12/19/17   Erick Blinks, MD  oxyCODONE-acetaminophen (PERCOCET/ROXICET) 5-325 MG tablet Take 1 tablet by mouth every 4 (four) hours as needed. 04/27/20   Burgess Amor, PA-C    Allergies    Patient has no known allergies.  Review of Systems   Review of Systems  Constitutional: Negative for chills and fever.  HENT: Negative for congestion and sore throat.   Eyes: Negative.   Respiratory: Positive for shortness of breath. Negative for cough, chest tightness and wheezing.        Describes baseline sob  Cardiovascular: Positive for leg swelling. Negative for chest pain and palpitations.       Chronic bilateral leg edema.   Gastrointestinal: Negative for abdominal pain, nausea and vomiting.  Genitourinary: Negative.   Musculoskeletal: Negative for arthralgias, joint swelling and neck pain.  Skin: Negative.  Negative for rash and wound.  Neurological: Negative for dizziness, weakness, light-headedness, numbness and  headaches.  Psychiatric/Behavioral: Negative.     Physical Exam Updated Vital Signs BP (!) 168/88   Pulse 73   Temp 99 F (37.2 C) (Oral)   Resp (!) 25   Ht 6\' 2"  (1.88 m)   Wt (!) 241 kg   SpO2 100%   BMI 68.22 kg/m   Physical Exam Vitals and nursing note reviewed.  Constitutional:      Appearance: He is well-developed.  HENT:     Head: Normocephalic and atraumatic.  Eyes:     Conjunctiva/sclera: Conjunctivae normal.  Cardiovascular:     Rate and Rhythm: Normal rate. Rhythm irregular.     Pulses:  Normal pulses.     Heart sounds: Normal heart sounds.  Pulmonary:     Effort: Pulmonary effort is normal.     Breath sounds: Normal breath sounds. No wheezing or rhonchi.  Abdominal:     General: Bowel sounds are normal.     Palpations: Abdomen is soft.     Tenderness: There is no abdominal tenderness.  Musculoskeletal:        General: Normal range of motion.     Cervical back: Normal range of motion.     Thoracic back: Tenderness present.       Back:     Right lower leg: Edema present.     Left lower leg: Edema present.     Comments: Bilateral leg edema, right greater than left (chronic and unchanged per pt). Skin changes suggesting chronic edema. Point tender to palpation left inferior to scapula. No deformity, no skin changes, rash, edema.   Skin:    General: Skin is warm and dry.  Neurological:     Mental Status: He is alert.     ED Results / Procedures / Treatments   Labs (all labs ordered are listed, but only abnormal results are displayed) Labs Reviewed  D-DIMER, QUANTITATIVE (NOT AT [facility]) - Abnormal; Notable for the following components:      Result Value   D-Dimer, Quant 0.54 (*)    All other components within normal limits  CBC WITH DIFFERENTIAL/PLATELET - Abnormal; Notable for the following components:   Platelets 97 (*)    All other components within normal limits  BASIC METABOLIC PANEL    EKG None  Radiology DG Chest 2 View  Result Date: 04/27/2020 CLINICAL DATA:  Pleuritic shoulder pain. EXAM: CHEST - 2 VIEW COMPARISON:  12/18/2017 FINDINGS: Mild cardiac enlargement. No pleural effusion or edema identified. No airspace densities identified. The visualized osseous structures are unremarkable. IMPRESSION: No acute cardiopulmonary abnormalities. Cardiac enlargement. Electronically Signed   By: 12/20/2017 M.D.   On: 04/27/2020 11:58   DG Shoulder Left  Result Date: 04/27/2020 CLINICAL DATA:  Left shoulder pain EXAM: LEFT SHOULDER - 2+ VIEW COMPARISON:   None. FINDINGS: There is no evidence of fracture or dislocation. There is no evidence of arthropathy or other focal bone abnormality. Soft tissues are unremarkable. IMPRESSION: Negative. Electronically Signed   By: 06/27/2020 D.O.   On: 04/27/2020 12:00    Procedures Procedures (including critical care time)  Medications Ordered in ED Medications  HYDROcodone-acetaminophen (NORCO/VICODIN) 5-325 MG per tablet 1 tablet (1 tablet Oral Given 04/27/20 1259)  enoxaparin (LOVENOX) 100 mg/mL injection 360 mg (360 mg Subcutaneous Given 04/27/20 1552)    ED Course  I have reviewed the triage vital signs and the nursing notes.  Pertinent labs & imaging results that were available during my care of the patient were reviewed by me and  considered in my medical decision making (see chart for details).    MDM Rules/Calculators/A&P                          On further review of patients chart and confirming his home medicines, he is not on Xarelto or other anticoagulant.  He is on metoprolol and diltiazem for rate control.  Had cardiology consult when diagnosed with a fib and Chads vasc score was 0 , so not started on blood thinners.  He was recommended hematology consult as outpt however given thrombocytopenia which still is present today. Has not had this f/u evaluation.    Chads vasc score still 0 today.   He denies sob today, however, there is a pleuritic component to his back/scapular pain today.  He is over the weight limit for obtaining a CT angio (max weight 500) and VQ scan (max weight 400).  D dimer borderline elevated at 0.54.  Discussed additional imaging with pt and he is not interested in CT imaging today, although could be completed at [facility] with weight limit of 600. Pt also doppler US of legs.  Discussed with Dr  Ellin Saba who recommends imaging but if pt refuses, would give him at least a one time 1.5 mg/kg dose of lovenox prior to dc. Would not prescribe additional anticoagulation  without more definitive diagnosis. Discussed these issues and our concerns about possibility of PE as source of his sx.  Pt understands the possible risks of having an undiagnosed PE and is sure his sx are muscle strain.  He was given oxycodone for sx relief, also discussed heat pad tx.  Strict return precautions discussed, also referral to Dr Ellin Saba given regarding current sx but also for his chronic thrombocytopenia that never was evaluated after his prior admission during his atrial fibrillation diagnosis.  Final Clinical Impression(s) / ED Diagnoses Final diagnoses:  Acute left-sided thoracic back pain  Pleuritic pain  Thrombocytopenia (HCC)    Rx / DC Orders ED Discharge Orders         Ordered    oxyCODONE-acetaminophen (PERCOCET/ROXICET) 5-325 MG tablet  Every 4 hours PRN        04/27/20 1606           Burgess Amor, PA-C 04/27/20 1606    Burgess Amor, PA-C 04/27/20 1612    Bethann Berkshire, MD 04/28/20 1657

## 2020-04-27 NOTE — Discharge Instructions (Addendum)
You may take the oxycodone prescribed for your back pain.  You may also try a heating pad applied to the site of pain - 20 minutes several times daily.  Do not drive within 4 hours of taking oxycodone as this will make you drowsy.    As our discussion, we have not ruled out the possibility of having a pulmonary embolism which is a potentially life threatening condition.  I advise immediate reevaluation if you develop any worsening symptoms, especially shortness of breath.  I have provided information about this condition for your information.  Your platelet count is also low today as it has been in the past.  You would benefit from seeing Dr Ellin Saba for this problem - call for an office visit with him.

## 2020-04-27 NOTE — ED Triage Notes (Signed)
Pt reports left shoulder pain x 2 days.  Denies injury.  Says is worse with position changes.  Denies any neck or chest pain.

## 2020-04-27 NOTE — ED Notes (Signed)
Pharmacy tech called to update pt's med list.

## 2020-05-28 ENCOUNTER — Encounter (HOSPITAL_COMMUNITY): Payer: Self-pay | Admitting: Hematology

## 2020-05-28 ENCOUNTER — Other Ambulatory Visit: Payer: Self-pay

## 2020-05-28 ENCOUNTER — Inpatient Hospital Stay (HOSPITAL_COMMUNITY): Payer: PRIVATE HEALTH INSURANCE

## 2020-05-28 ENCOUNTER — Inpatient Hospital Stay (HOSPITAL_COMMUNITY): Payer: PRIVATE HEALTH INSURANCE | Attending: Hematology | Admitting: Hematology

## 2020-05-28 VITALS — BP 135/86 | HR 87 | Temp 96.9°F | Ht 74.0 in | Wt >= 6400 oz

## 2020-05-28 DIAGNOSIS — I4891 Unspecified atrial fibrillation: Secondary | ICD-10-CM | POA: Diagnosis not present

## 2020-05-28 DIAGNOSIS — Z87891 Personal history of nicotine dependence: Secondary | ICD-10-CM | POA: Diagnosis not present

## 2020-05-28 DIAGNOSIS — D696 Thrombocytopenia, unspecified: Secondary | ICD-10-CM | POA: Insufficient documentation

## 2020-05-28 LAB — CBC WITH DIFFERENTIAL/PLATELET
Abs Immature Granulocytes: 0.01 10*3/uL (ref 0.00–0.07)
Basophils Absolute: 0.1 10*3/uL (ref 0.0–0.1)
Basophils Relative: 1 %
Eosinophils Absolute: 0.3 10*3/uL (ref 0.0–0.5)
Eosinophils Relative: 5 %
HCT: 43.4 % (ref 39.0–52.0)
Hemoglobin: 13.3 g/dL (ref 13.0–17.0)
Immature Granulocytes: 0 %
Lymphocytes Relative: 37 %
Lymphs Abs: 1.9 10*3/uL (ref 0.7–4.0)
MCH: 27.7 pg (ref 26.0–34.0)
MCHC: 30.6 g/dL (ref 30.0–36.0)
MCV: 90.4 fL (ref 80.0–100.0)
Monocytes Absolute: 0.5 10*3/uL (ref 0.1–1.0)
Monocytes Relative: 9 %
Neutro Abs: 2.5 10*3/uL (ref 1.7–7.7)
Neutrophils Relative %: 48 %
Platelets: 101 10*3/uL — ABNORMAL LOW (ref 150–400)
RBC: 4.8 MIL/uL (ref 4.22–5.81)
RDW: 15.5 % (ref 11.5–15.5)
WBC: 5.1 10*3/uL (ref 4.0–10.5)
nRBC: 0 % (ref 0.0–0.2)

## 2020-05-28 LAB — RETICULOCYTES
Immature Retic Fract: 15.8 % (ref 2.3–15.9)
RBC.: 4.77 MIL/uL (ref 4.22–5.81)
Retic Count, Absolute: 77.3 10*3/uL (ref 19.0–186.0)
Retic Ct Pct: 1.6 % (ref 0.4–3.1)

## 2020-05-28 LAB — COMPREHENSIVE METABOLIC PANEL
ALT: 26 U/L (ref 0–44)
AST: 23 U/L (ref 15–41)
Albumin: 3.7 g/dL (ref 3.5–5.0)
Alkaline Phosphatase: 57 U/L (ref 38–126)
Anion gap: 8 (ref 5–15)
BUN: 18 mg/dL (ref 6–20)
CO2: 27 mmol/L (ref 22–32)
Calcium: 8.7 mg/dL — ABNORMAL LOW (ref 8.9–10.3)
Chloride: 106 mmol/L (ref 98–111)
Creatinine, Ser: 1.48 mg/dL — ABNORMAL HIGH (ref 0.61–1.24)
GFR, Estimated: >60 mL/min (ref >60)
Glucose, Bld: 86 mg/dL (ref 70–99)
Potassium: 4 mmol/L (ref 3.5–5.1)
Sodium: 141 mmol/L (ref 135–145)
Total Bilirubin: 0.6 mg/dL (ref 0.3–1.2)
Total Protein: 6.8 g/dL (ref 6.5–8.1)

## 2020-05-28 LAB — LACTATE DEHYDROGENASE: LDH: 164 U/L (ref 98–192)

## 2020-05-28 LAB — HEPATITIS B SURFACE ANTIGEN: Hepatitis B Surface Ag: NONREACTIVE

## 2020-05-28 LAB — HEPATITIS C ANTIBODY: HCV Ab: NONREACTIVE

## 2020-05-28 LAB — VITAMIN B12: Vitamin B-12: 162 pg/mL — ABNORMAL LOW (ref 180–914)

## 2020-05-28 LAB — HEPATITIS B CORE ANTIBODY, TOTAL: Hep B Core Total Ab: NONREACTIVE

## 2020-05-28 LAB — FOLATE: Folate: 7.6 ng/mL (ref >5.9)

## 2020-05-28 LAB — HEPATITIS B SURFACE ANTIBODY,QUALITATIVE: Hep B S Ab: NONREACTIVE

## 2020-05-28 NOTE — Progress Notes (Signed)
[facility] [address], [place] [address]   CLINIC:  Medical Oncology/Hematology  Patient Care Team: Berton Lan as PCP - General (Family Medicine) Laqueta Linden, MD (Inactive) as Attending Physician (Cardiology)  CHIEF COMPLAINTS/PURPOSE OF CONSULTATION:  Evaluation of thrombocytopenia  HISTORY OF PRESENTING ILLNESS:  Scott Clark 39 y.o. male is here because of evaluation of thrombocytopenia, at the request of Dr. Bethann Berkshire. He went to [facility] on 10/8 for left back pain.  Today he reports feeling well. He reports that his back pain is improving. His breathing has improved as well. He denies easy bruising or bleeding. He had knee surgery in 2001 and his teeth were pulled in 2018 and he denies having prolonged bleeding then. He denies F/C or night sweats. He denies presence of lupus or RA. He denies history of DVT's. He has never had blood transfusions. He denies abdominal pain or issues with reflux. His legs are chroncially swollen.  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 He smoked 1 PPD for 10 years and quit smoking in 04/2020. He denies history of thrombocytopenia or leukemia in his family. His mother may have a history of lupus.   MEDICAL HISTORY:  Past Medical History:  Diagnosis Date  . Abdominal obesity-metabolic syndrome type 3   . Bilateral edema of lower extremity    Normal ABI's (09/2015)  . Sleep apnea     SURGICAL HISTORY: No past surgical history on file.  SOCIAL HISTORY: Social History   Socioeconomic History  . Marital status: Married    Spouse name: Not on file  . Number of children: Not on file  . Years of education: Not on file  . Highest education level: Not on file  Occupational History  . Not on file  Tobacco Use  . Smoking status: Former Smoker    Packs/day: 1.00    Types: Cigarettes  . Smokeless tobacco: Never Used  Vaping Use  . Vaping Use:  Every day  Substance and Sexual Activity  . Alcohol use: Yes    Alcohol/week: 0.0 standard drinks    Comment: occ. use  . Drug use: No  . Sexual activity: Yes  Other Topics Concern  . Not on file  Social History Narrative  . Not on file   Social Determinants of Health   Financial Resource Strain: Medium Risk  . Difficulty of Paying Living Expenses: Somewhat hard  Food Insecurity: No Food Insecurity  . Worried About Programme researcher, broadcasting/film/video in the Last Year: Never true  . Ran Out of Food in the Last Year: Never true  Transportation Needs: No Transportation Needs  . Lack of Transportation (Medical): No  . Lack of Transportation (Non-Medical): No  Physical Activity: Inactive  . Days of Exercise per Week: 0 days  . Minutes of Exercise per Session: 0 min  Stress: No Stress Concern Present  . Feeling of Stress : Not at all  Social Connections: Moderately Isolated  . Frequency of Communication with Friends and Family: Three times a week  . Frequency of Social Gatherings with Friends and Family: Once a week  . Attends Religious Services: Never  . Active Member of Clubs or Organizations: No  . Attends Banker Meetings: Never  . Marital Status: Married  Catering manager Violence: Not At Risk  . Fear of Current or Ex-Partner: No  . Emotionally Abused: No  . Physically Abused: No  .  Sexually Abused: No    FAMILY HISTORY: Family History  Problem Relation Age of Onset  . Hypertension Father     ALLERGIES:  has No Known Allergies.  MEDICATIONS:  Current Outpatient Medications  Medication Sig Dispense Refill  . aspirin EC 81 MG EC tablet Take 1 tablet (81 mg total) by mouth daily.    Marland Kitchen diltiazem (CARDIZEM CD) 240 MG 24 hr capsule Take 1 capsule (240 mg total) by mouth daily. 30 capsule 1  . metoprolol tartrate (LOPRESSOR) 100 MG tablet Take 50 mg by mouth 2 (two) times daily.    Marland Kitchen oxyCODONE-acetaminophen (PERCOCET/ROXICET) 5-325 MG tablet Take 1 tablet by mouth every 4  (four) hours as needed. 20 tablet 0   No current facility-administered medications for this visit.    REVIEW OF SYSTEMS:   Review of Systems  Constitutional: Positive for fatigue (60%). Negative for appetite change, chills, diaphoresis and fever.  Respiratory: Positive for cough and shortness of breath.   Cardiovascular: Positive for leg swelling (chronically swollen) and palpitations (A-fib).  Gastrointestinal: Negative for abdominal pain.  Genitourinary: Positive for frequency.   Musculoskeletal: Positive for back pain (8/10 lower back pain).  Hematological: Does not bruise/bleed easily.  All other systems reviewed and are negative.    PHYSICAL EXAMINATION: ECOG PERFORMANCE STATUS: 0 - Asymptomatic  Vitals:   05/28/20 1319  BP: 135/86  Pulse: 87  Temp: (!) 96.9 F (36.1 C)  SpO2: 95%   Filed Weights   05/28/20 1319  Weight: (!) 536 lb 3.2 oz (243.2 kg)   Physical Exam Vitals reviewed.  Constitutional:      Appearance: Normal appearance. He is obese.  Cardiovascular:     Rate and Rhythm: Normal rate and regular rhythm.     Pulses: Normal pulses.     Heart sounds: Normal heart sounds.  Pulmonary:     Effort: Pulmonary effort is normal.     Breath sounds: Normal breath sounds.  Musculoskeletal:     Thoracic back: No tenderness or bony tenderness.     Right lower leg: Edema (lymphedema) present.     Left lower leg: Edema (lymphedema) present.  Lymphadenopathy:     Upper Body:     Right upper body: No supraclavicular, axillary or pectoral adenopathy.     Left upper body: No supraclavicular, axillary or pectoral adenopathy.  Neurological:     General: No focal deficit present.     Mental Status: He is alert and oriented to person, place, and time.  Psychiatric:        Mood and Affect: Mood normal.        Behavior: Behavior normal.      LABORATORY DATA:  I have reviewed the data as listed Recent Results (from the past 2160 hour(s))  D-dimer, quantitative  (not at [facility])     Status: Abnormal   Collection Time: 04/27/20 11:02 AM  Result Value Ref Range   D-Dimer, Quant 0.54 (H) 0.00 - 0.50 ug/mL-FEU    Comment: (NOTE) At the manufacturer cut-off value of 0.5 g/mL FEU, this assay has a negative predictive value of 95-100%.This assay is intended for use in conjunction with a clinical pretest probability (PTP) assessment model to exclude pulmonary embolism (PE) and deep venous thrombosis (DVT) in outpatients suspected of PE or DVT. Results should be correlated with clinical presentation. Performed at [facility], [address], [place], [place] [address]   CBC with Differential/Platelet     Status: Abnormal   Collection Time: 04/27/20 11:02 AM  Result Value Ref Range   WBC 5.4 4.0 - 10.5 K/uL   RBC 4.75 4.22 - 5.81 MIL/uL   Hemoglobin 13.5 13.0 - 17.0 g/dL   HCT 16.141.9 39 - 52 %   MCV 88.2 80.0 - 100.0 fL   MCH 28.4 26.0 - 34.0 pg   MCHC 32.2 30.0 - 36.0 g/dL   RDW 09.615.4 04.511.5 - 40.915.5 %   Platelets 97 (L) 150 - 400 K/uL    Comment: PLATELET COUNT CONFIRMED BY SMEAR SPECIMEN CHECKED FOR CLOTS Immature Platelet Fraction may be clinically indicated, consider ordering this additional test [id number]    nRBC 0.0 0.0 - 0.2 %   Neutrophils Relative % 49 %   Neutro Abs 2.7 1.7 - 7.7 K/uL   Lymphocytes Relative 35 %   Lymphs Abs 1.9 0.7 - 4.0 K/uL   Monocytes Relative 9 %   Monocytes Absolute 0.5 0.1 - 1.0 K/uL   Eosinophils Relative 6 %   Eosinophils Absolute 0.3 0.0 - 0.5 K/uL   Basophils Relative 1 %   Basophils Absolute 0.1 0.0 - 0.1 K/uL   Immature Granulocytes 0 %   Abs Immature Granulocytes 0.01 0.00 - 0.07 K/uL    Comment: Performed at [facility], [address] [place], [place] [address]  Basic metabolic panel     Status: None   Collection Time: 04/27/20 11:02 AM  Result Value Ref Range   Sodium 139 135 - 145 mmol/L   Potassium 4.0 3.5 - 5.1 mmol/L   Chloride 104 98 - 111 mmol/L   CO2 26 22 - 32 mmol/L   Glucose, Bld 76 70  - 99 mg/dL    Comment: Glucose reference range applies only to samples taken after fasting for at least 8 hours.   BUN 14 6 - 20 mg/dL   Creatinine, Ser 1.301.19 0.61 - 1.24 mg/dL   Calcium 9.1 8.9 - 86.510.3 mg/dL   GFR calc non Af Amer >60 >60 mL/min   Anion gap 9 5 - 15    Comment: Performed at [facility], [address] [place], [place] [address]    RADIOGRAPHIC STUDIES: I have personally reviewed the radiological images as listed and agreed with the findings in the report. No results found.  ASSESSMENT:  1.  Moderate thrombocytopenia: -Evaluated for thrombocytopenia of 97 on 04/27/2020. -Review of labs shows platelet count varying between 76 and 102 since 2017. -Denies any easy bruising or bleeding. -Dental extraction 2 to 3 years ago without any additional bleeding.  Knee surgery in 2001 without any bleeding complications. -Denies any connective tissue disorders.  Takes aspirin for atrial fibrillation. -Denies any over-the-counter herbal supplements. -Seen in the ER on 04/27/2020 with left posterior chest wall pain and shortness of breath.  D-dimer was 0.54.  [place], patient refused.  He no longer has pain.  2.  Social/family history: -Smoked 1 pack/day for 10 years, quit 3 months ago. 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 -Mother has lupus but no family history of leukemias or malignancies.   PLAN:  1.  Moderate thrombocytopenia: -I have reviewed lab work and platelet count over the last several years. -We will check his platelet count today.  Will evaluate for nutritional deficiencies and connective tissue disorders.  We will also check for infectious etiologies which can cause thrombocytopenia. -Differential diagnosis favors immune mediated thrombocytopenia given his young age. -RTC 2 weeks to discuss results.  2.  Atrial fibrillation: -Continue Cardizem to 40 mg  daily and Lopressor 50 mg twice daily.   Doreatha Massed, MD  05/28/20 2:07 PM  [facility] [phone]   I, Drue Second, am acting as a scribe for Dr. Payton Mccallum.  I, Doreatha Massed MD, have reviewed the above documentation for accuracy and completeness, and I agree with the above.

## 2020-05-28 NOTE — Patient Instructions (Signed)
[facility] at [facility] Discharge Instructions  You were seen and examined today by Dr. Ellin Saba. Dr. Ellin Saba is a hematologist, meaning he specializes in blood disorders.  Dr. Ellin Saba has recommended lab work today and you will return to the clinic in 2-3 weeks.   Thank you for choosing  Cancer Center at [facility] to provide your oncology and hematology care.  To afford each patient quality time with our provider, please arrive at least 15 minutes before your scheduled appointment time.   If you have a lab appointment with the Cancer Center please come in thru the Main Entrance and check in at the main information desk.  You need to re-schedule your appointment should you arrive 10 or more minutes late.  We strive to give you quality time with our providers, and arriving late affects you and other patients whose appointments are after yours.  Also, if you no show three or more times for appointments you may be dismissed from the clinic at the providers discretion.     Again, thank you for choosing [facility].  Our hope is that these requests will decrease the amount of time that you wait before being seen by our physicians.       _____________________________________________________________  Should you have questions after your visit to [facility], please contact our office at [phone] and follow the prompts.  Our office hours are 8:00 a.m. and 4:30 p.m. Monday - Friday.  Please note that voicemails left after 4:00 p.m. may not be returned until the following business day.  We are closed weekends and major holidays.  You do have access to a nurse 24-7, just call the main number to the clinic [phone] and do not press any options, hold on the line and a nurse will answer the phone.    For prescription refill requests, have your pharmacy contact our office and allow 72 hours.    Due to Covid, you will need  to wear a mask upon entering the hospital. If you do not have a mask, a mask will be given to you at the Main Entrance upon arrival. For doctor visits, patients may have 1 support person age 71 or older with them. For treatment visits, patients can not have anyone with them due to social distancing guidelines and our immunocompromised population.

## 2020-05-29 LAB — PROTEIN ELECTROPHORESIS, SERUM
A/G Ratio: 1 (ref 0.7–1.7)
Albumin ELP: 3.3 g/dL (ref 2.9–4.4)
Alpha-1-Globulin: 0.2 g/dL (ref 0.0–0.4)
Alpha-2-Globulin: 0.7 g/dL (ref 0.4–1.0)
Beta Globulin: 1 g/dL (ref 0.7–1.3)
Gamma Globulin: 1.3 g/dL (ref 0.4–1.8)
Globulin, Total: 3.2 g/dL (ref 2.2–3.9)
M-Spike, %: 0.3 g/dL — ABNORMAL HIGH
Total Protein ELP: 6.5 g/dL (ref 6.0–8.5)

## 2020-05-29 LAB — RHEUMATOID FACTOR: Rheumatoid fact SerPl-aCnc: <10 IU/mL (ref 0.0–13.9)

## 2020-05-29 LAB — H PYLORI, IGM, IGG, IGA AB
H Pylori IgG: 5.53 Index Value — ABNORMAL HIGH (ref 0.00–0.79)
H. Pylogi, Iga Abs: 30.7 units — ABNORMAL HIGH (ref 0.0–8.9)
H. Pylogi, Igm Abs: <9 units (ref 0.0–8.9)

## 2020-05-29 LAB — ANTINUCLEAR ANTIBODIES, IFA: ANA Ab, IFA: NEGATIVE

## 2020-05-30 LAB — LUPUS ANTICOAGULANT PANEL
DRVVT: 41.2 s (ref 0.0–47.0)
PTT Lupus Anticoagulant: 36.9 s (ref 0.0–51.9)

## 2020-05-30 LAB — COPPER, SERUM: Copper: 94 ug/dL (ref 69–132)

## 2020-06-01 LAB — METHYLMALONIC ACID, SERUM: Methylmalonic Acid, Quantitative: 170 nmol/L (ref 0–378)

## 2020-06-20 ENCOUNTER — Inpatient Hospital Stay (HOSPITAL_COMMUNITY): Payer: PRIVATE HEALTH INSURANCE | Attending: Hematology | Admitting: Hematology

## 2020-08-01 ENCOUNTER — Telehealth: Payer: Self-pay | Admitting: *Deleted

## 2020-08-01 NOTE — Telephone Encounter (Addendum)
Our office received a clearance request for pre op clearance. Pt was last seen by cardiology 2017. Pt has been scheduled to see Dr. Eleonore Chiquito in our [place] office 08/10/20 @ 9:20 am. I sent a message to our chart prep team to request any records that may be needed. I will place the surgery clearance request into Epic for the MD for the upcoming appt.      [facility] Pre-operative Risk Assessment    HEARTCARE STAFF: - Please ensure there is not already an duplicate clearance open for this procedure. - Under Visit Info/Reason for Call, type in Other and utilize the format Clearance MM/DD/YY or Clearance TBD. Do not use dashes or single digits. - If request is for dental extraction, please clarify the # of teeth to be extracted.  Request for surgical clearance: PT HAS NEW PT APPT 08/10/20 DUE TO PT LAST SEEN BY CARDIOLOGY 2017  1. 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 When is this surgery scheduled? TBD   3. What type of clearance is required (medical clearance vs. Pharmacy clearance to hold med vs. Both)? MEDICAL  4. Are there any medications that need to be held prior to surgery and how long? ASA    5. Practice name and name of physician performing surgery? [facility]; DR. ERIC WILSON   6. What is the office phone number? [phone]   7.   What is the office fax number? [place]: Lindwood Coke, RN  8.   Anesthesia type (None, local, MAC, general) ? GENERAL   Julaine Hua 08/01/2020, 2:19 PM  _________________________________________________________________   (provider comments below)

## 2020-08-03 ENCOUNTER — Other Ambulatory Visit: Payer: Self-pay | Admitting: General Surgery

## 2020-08-06 ENCOUNTER — Other Ambulatory Visit: Payer: Self-pay | Admitting: General Surgery

## 2020-08-06 ENCOUNTER — Other Ambulatory Visit (HOSPITAL_COMMUNITY): Payer: Self-pay | Admitting: General Surgery

## 2020-08-09 ENCOUNTER — Telehealth: Payer: Self-pay | Admitting: *Deleted

## 2020-08-09 NOTE — Telephone Encounter (Signed)
Notes received sent to referral for scheduling  [facility] [phone]

## 2020-08-10 ENCOUNTER — Ambulatory Visit: Payer: PRIVATE HEALTH INSURANCE | Admitting: Cardiovascular Disease

## 2020-08-16 ENCOUNTER — Inpatient Hospital Stay (HOSPITAL_COMMUNITY): Payer: PRIVATE HEALTH INSURANCE | Attending: Hematology | Admitting: Hematology

## 2020-08-16 ENCOUNTER — Other Ambulatory Visit: Payer: Self-pay

## 2020-08-16 ENCOUNTER — Inpatient Hospital Stay (HOSPITAL_COMMUNITY): Payer: PRIVATE HEALTH INSURANCE

## 2020-08-16 DIAGNOSIS — E538 Deficiency of other specified B group vitamins: Secondary | ICD-10-CM | POA: Insufficient documentation

## 2020-08-16 DIAGNOSIS — D696 Thrombocytopenia, unspecified: Secondary | ICD-10-CM | POA: Diagnosis not present

## 2020-08-16 MED ORDER — CYANOCOBALAMIN 1000 MCG/ML IJ SOLN
INTRAMUSCULAR | Status: AC
  Filled 2020-08-16: qty 1

## 2020-08-16 MED ORDER — CYANOCOBALAMIN 1000 MCG/ML IJ SOLN
1000.0000 ug | Freq: Once | INTRAMUSCULAR | Status: AC
  Administered 2020-08-16: 1000 ug via INTRAMUSCULAR

## 2020-08-16 NOTE — Patient Instructions (Signed)
[facility] at [facility] Discharge Instructions  You were seen today by Dr. Ellin Saba. He went over your recent results. You received a vitamin B12 injection today; purchase vitamin B12 over the counter and take 1 mg (1,000 mcg) daily. Drink plenty of water daily, 1-2 liters, to keep your kidneys flushed. Dr. Ellin Saba will see you back in 2 months for labs and follow up.   Thank you for choosing [facility] at [facility] to provide your oncology and hematology care.  To afford each patient quality time with our provider, please arrive at least 15 minutes before your scheduled appointment time.   If you have a lab appointment with the Cancer Center please come in thru the Main Entrance and check in at the main information desk  You need to re-schedule your appointment should you arrive 10 or more minutes late.  We strive to give you quality time with our providers, and arriving late affects you and other patients whose appointments are after yours.  Also, if you no show three or more times for appointments you may be dismissed from the clinic at the providers discretion.     Again, thank you for choosing [facility].  Our hope is that these requests will decrease the amount of time that you wait before being seen by our physicians.       _____________________________________________________________  Should you have questions after your visit to [facility], please contact our office at [phone] between the hours of 8:00 a.m. and 4:30 p.m.  Voicemails left after 4:00 p.m. will not be returned until the following business day.  For prescription refill requests, have your pharmacy contact our office and allow 72 hours.    Cancer Center Support Programs:   > Cancer Support Group  2nd Tuesday of the month 1pm-2pm, Journey Room

## 2020-08-16 NOTE — Progress Notes (Signed)
Patient was assessed by Dr. Ellin Saba and labs have been reviewed.  Patient is okay to proceed withVitamin B12 injection today. Primary RN and pharmacy aware.   Scott Clark presents today for injection per the provider's orders.  Vitamin B12 administration without incident; injection site WNL; see MAR for injection details.  Patient tolerated procedure well and without incident.  No questions or complaints noted at this time. Discharged ambulatory in stable condition.

## 2020-08-16 NOTE — Progress Notes (Addendum)
[facility] [address], [place] [address]   CLINIC:  Medical Oncology/Hematology  PCP:  Scott Handler, PA-C [address] [place] [address]  [phone]  REASON FOR VISIT:  Follow-up for thrombocytopenia  PRIOR THERAPY: None  CURRENT THERAPY: Observation  INTERVAL HISTORY:  [name], a 40 y.o. male, returns for routine follow-up for his thrombocytopenia. Scott Clark was last seen on 05/28/2020. He is scheduled for a gastric bypass and needs clearance due to his low platelet count.  Today he reports feeling well. He reports that his surgery is at least 3 months out. He denies having any nosebleeds, hematuria or hematochezia. He denies having symptoms of heartburn or abdominal pain.   REVIEW OF SYSTEMS:  Review of Systems  Constitutional: Positive for fatigue (75%). Negative for appetite change.  HENT:   Negative for nosebleeds.   Gastrointestinal: Negative for abdominal pain and blood in stool.  Genitourinary: Negative for hematuria.   All other systems reviewed and are negative.   PAST MEDICAL/SURGICAL HISTORY:  Past Medical History:  Diagnosis Date  . Abdominal obesity-metabolic syndrome type 3   . Bilateral edema of lower extremity    Normal ABI's (09/2015)  . Sleep apnea    No past surgical history on file.  SOCIAL HISTORY:  Social History   Socioeconomic History  . Marital status: Married    Spouse name: Not on file  . Number of children: Not on file  . Years of education: Not on file  . Highest education level: Not on file  Occupational History  . Not on file  Tobacco Use  . Smoking status: Former Smoker    Packs/day: 1.00    Types: Cigarettes  . Smokeless tobacco: Never Used  Vaping Use  . Vaping Use: Every day  Substance and Sexual Activity  . Alcohol use: Yes    Alcohol/week: 0.0 standard drinks    Comment: occ. use  . Drug use: No  . Sexual activity: Yes  Other Topics Concern  . Not on  file  Social History Narrative  . Not on file   Social Determinants of Health   Financial Resource Strain: Medium Risk  . Difficulty of Paying Living Expenses: Somewhat hard  Food Insecurity: No Food Insecurity  . Worried About Programme researcher, broadcasting/film/video in the Last Year: Never true  . Ran Out of Food in the Last Year: Never true  Transportation Needs: No Transportation Needs  . Lack of Transportation (Medical): No  . Lack of Transportation (Non-Medical): No  Physical Activity: Inactive  . Days of Exercise per Week: 0 days  . Minutes of Exercise per Session: 0 min  Stress: No Stress Concern Present  . Feeling of Stress : Not at all  Social Connections: Moderately Isolated  . Frequency of Communication with Friends and Family: Three times a week  . Frequency of Social Gatherings with Friends and Family: Once a week  . Attends Religious Services: Never  . Active Member of Clubs or Organizations: No  . Attends Banker Meetings: Never  . Marital Status: Married  Catering manager Violence: Not At Risk  . Fear of Current or Ex-Partner: No  . Emotionally Abused: No  . Physically Abused: No  . Sexually Abused: No    FAMILY HISTORY:  Family History  Problem Relation Age of Onset  . Hypertension Father     CURRENT MEDICATIONS:  Current Outpatient Medications  Medication Sig Dispense Refill  . aspirin  EC 81 MG EC tablet Take 1 tablet (81 mg total) by mouth daily.    Marland Kitchen buPROPion (WELLBUTRIN XL) 300 MG 24 hr tablet Take 300 mg by mouth at bedtime.    . celecoxib (CELEBREX) 200 MG capsule Take by mouth.    . diltiazem (CARDIZEM CD) 240 MG 24 hr capsule Take 1 capsule (240 mg total) by mouth daily. 30 capsule 1  . metoprolol tartrate (LOPRESSOR) 100 MG tablet Take 50 mg by mouth 2 (two) times daily.    . mupirocin ointment (BACTROBAN) 2 % SMARTSIG:Sparingly Topical 3 Times Daily    . traMADol-acetaminophen (ULTRACET) 37.5-325 MG tablet Take 1-2 tablets by mouth every 8 (eight)  hours as needed.     Current Facility-Administered Medications  Medication Dose Route Frequency Provider Last Rate Last Admin  . cyanocobalamin ((VITAMIN B-12)) injection 1,000 mcg  1,000 mcg Intramuscular Once Doreatha Massed, MD        ALLERGIES:  No Known Allergies  PHYSICAL EXAM:  Performance status (ECOG): 0 - Asymptomatic  There were no vitals filed for this visit. Wt Readings from Last 3 Encounters:  05/28/20 (!) 536 lb 3.2 oz (243.2 kg)  04/27/20 (!) 531 lb 4.9 oz (241 kg)  12/16/17 (!) 525 lb (238.1 kg)   Physical Exam  LABORATORY DATA:  I have reviewed the labs as listed.  CBC Latest Ref Rng & Units 05/28/2020 04/27/2020 12/18/2017  WBC 4.0 - 10.5 K/uL 5.1 5.4 4.6  Hemoglobin 13.0 - 17.0 g/dL 59.1 63.8 12.6(L)  Hematocrit 39.0 - 52.0 % 43.4 41.9 41.9  Platelets 150 - 400 K/uL 101(L) 97(L) 76(L)   CMP Latest Ref Rng & Units 05/28/2020 04/27/2020 12/18/2017  Glucose 70 - 99 mg/dL 86 76 84  BUN 6 - 20 mg/dL 18 14 17   Creatinine 0.61 - 1.24 mg/dL ) 4.66(Z 9.93)  Sodium 135 - 145 mmol/L 141 139 139  Potassium 3.5 - 5.1 mmol/L 4.0 4.0 3.7  Chloride 98 - 111 mmol/L 106 104 101  CO2 22 - 32 mmol/L 27 26 31   Calcium 8.9 - 10.3 mg/dL 5.70(V) 9.1 )  Total Protein 6.5 - 8.1 g/dL 6.8 - 7.4  Total Bilirubin 0.3 - 1.2 mg/dL 0.6 - 0.7  Alkaline Phos 38 - 126 U/L 57 - 65  AST 15 - 41 U/L 23 - 26  ALT 0 - 44 U/L 26 - 30      Component Value Date/Time   RBC 4.80 05/28/2020 1506   RBC 4.77 05/28/2020 1506   MCV 90.4 05/28/2020 1506   MCH 27.7 05/28/2020 1506   MCHC 30.6 05/28/2020 1506   RDW 15.5 05/28/2020 1506   LYMPHSABS 1.9 05/28/2020 1506   MONOABS 0.5 05/28/2020 1506   EOSABS 0.3 05/28/2020 1506   BASOSABS 0.1 05/28/2020 1506    DIAGNOSTIC IMAGING:  I have independently reviewed the scans and discussed with the patient. No results found.   ASSESSMENT:  1.  Moderate thrombocytopenia: -Evaluated for thrombocytopenia of 97 on 04/27/2020. -Review of  labs shows platelet count varying between 76 and 102 since 2017. -Denies any easy bruising or bleeding. -Dental extraction 2 to 3 years ago without any additional bleeding.  Knee surgery in 2001 without any bleeding complications. -Denies any connective tissue disorders.  Takes aspirin for atrial fibrillation. -Denies any over-the-counter herbal supplements. -Seen in the ER on 04/27/2020 with left posterior chest wall pain and shortness of breath.  D-dimer was 0.54.  Was recommended CT scan in [place], patient refused.  He no longer  has pain.  2.  Social/family history: -Smoked 1 pack/day for 10 years, quit 3 months ago. 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 -Mother has lupus but no family history of leukemias or malignancies.   PLAN:  1.  Moderate thrombocytopenia: -I have reviewed the his labs from 05/28/2020.  Platelet count is 101.  Normal hemoglobin and white count. -With platelet count close to 100,000, he is cleared for upcoming gastric bypass surgery from hematological standpoint. -He has seen Dr. Andrey Campanile at [facility].  He reports that he might be having surgery in 3 months.  We will plan to check platelet count in 2 months.  2.  Atrial fibrillation: -Continue Cardizem and Lopressor.  3.  Low vitamin B12 levels: -Vitamin B12 was 162.  He was given vitamin B12 injection today. -He was told to take B12 1 mg tablet daily.  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 -Discussed normal pathophysiology of monoclonal gammopathy.  He does not seem to have any "CRAB" features. -Recommend further work-up with immunofixation and free light chains at next visit.  Orders placed this encounter:  Orders Placed This Encounter  Procedures  . CBC with Differential/Platelet  . Comprehensive metabolic panel  . Protein electrophoresis, serum  . Kappa/lambda light chains  . Immunofixation electrophoresis  . Vitamin B12     Doreatha Massed, MD [facility] [phone]   I, Drue Second, am acting as a scribe for Dr. Payton Mccallum.  I, Doreatha Massed MD, have reviewed the above documentation for accuracy and completeness, and I agree with the above.

## 2020-08-20 ENCOUNTER — Other Ambulatory Visit: Payer: Self-pay

## 2020-08-20 ENCOUNTER — Ambulatory Visit (HOSPITAL_COMMUNITY)
Admission: RE | Admit: 2020-08-20 | Discharge: 2020-08-20 | Disposition: A | Discharge status: Home / Self Care | Payer: PRIVATE HEALTH INSURANCE | Source: Ambulatory Visit | Attending: General Surgery | Admitting: General Surgery

## 2020-08-20 DIAGNOSIS — J392 Other diseases of pharynx: Secondary | ICD-10-CM | POA: Insufficient documentation

## 2020-08-23 NOTE — Progress Notes (Unsigned)
Cardiology Office Note:   Date:  08/24/2020  NAME:  Scott Clark    MRN: [id number] DOB:  1981-01-18   PCP:  Ellwood Handler, PA-C  Cardiologist:  No primary care provider on file.   Referring MD: Gaynelle Adu, MD   Chief Complaint  Patient presents with  . Pre-op Exam   History of Present Illness:   Scott Clark is a 40 y.o. male with a hx of persistent AF, morbid obesity, OSA who is being seen today for the evaluation of preoperative assessment at the request of Ellwood Handler, PA-C. He is currently 540 pounds. BMI 69. He reports he is undergoing evaluation for bariatric surgery. Regarding his heart history he has a history of atrial fibrillation. This was diagnosed in May 2019. He left the hospital in sinus rhythm. He was evaluated in the emergency room in October 2021 and found to be in A. fib. He was not informed of this. He got his A. fib has been resolved. EKG today in office demonstrates again he is in atrial fibrillation. He has not followed up with cardiology. His CHADSVASC=1. He reports he does not feel his heart racing. He does get short of breath with exertion but he attributes this to his obesity. He works as an Art gallery manager. He denies any exertional chest pain. He can climb a flight of stairs if needed. He is a current smoker. 1 pack a day for 12 years. He has sleep apnea. He drinks socially. No illicit drugs reported. He works as an Medical illustrator. He is married with 2 children. He does have some lower extremity edema. He reports he was forced to take water pill for it but he does not do this as he does not like to go to the bathroom frequently. He will get back on this.  Problem List 1. Morbid obesity -BMI 68 2. OSA 3. Persistent Afib -11/2017 -spontaneous conversion to NSR -CHADSVASC=1 -return 04/2020  Past Medical History: Past Medical History:  Diagnosis Date  . Abdominal obesity-metabolic syndrome type 3   . Bilateral edema of lower extremity     Normal ABI's (09/2015)  . Hypertension   . Sleep apnea    Past Surgical History: Past Surgical History:  Procedure Laterality Date  . KNEE SURGERY      Current Medications: Current Meds  Medication Sig  . aspirin EC 81 MG EC tablet Take 1 tablet (81 mg total) by mouth daily.  Marland Kitchen buPROPion (WELLBUTRIN XL) 300 MG 24 hr tablet Take 300 mg by mouth at bedtime.  . celecoxib (CELEBREX) 200 MG capsule Take by mouth.  . diltiazem (CARDIZEM CD) 240 MG 24 hr capsule Take 1 capsule (240 mg total) by mouth daily.  . metoprolol succinate (TOPROL-XL) 200 MG 24 hr tablet Take 1 tablet (200 mg total) by mouth daily. Take with or immediately following a meal.  . mupirocin ointment (BACTROBAN) 2 % SMARTSIG:Sparingly Topical 3 Times Daily  . traMADol-acetaminophen (ULTRACET) 37.5-325 MG tablet Take 1-2 tablets by mouth every 8 (eight) hours as needed.  . [DISCONTINUED] metoprolol tartrate (LOPRESSOR) 100 MG tablet Take 50 mg by mouth 2 (two) times daily.     Allergies:    Patient has no known allergies.   Social History: Social History   Socioeconomic History  . Marital status: Married    Spouse name: Not on file  . Number of children: 2  . Years of education: Not on file  . Highest education level: Not on file  Occupational History  .  Not on file  Tobacco Use  . Smoking status: Current Every Day Smoker    Packs/day: 1.00    Years: 12.00    Pack years: 12.00    Types: Cigarettes  . Smokeless tobacco: Never Used  Vaping Use  . Vaping Use: Every day  Substance and Sexual Activity  . Alcohol use: Yes    Alcohol/week: 0.0 standard drinks    Comment: occ. use  . Drug use: No  . Sexual activity: Yes  Other Topics Concern  . Not on file  Social History Narrative  . Not on file   Social Determinants of Health   Financial Resource Strain: Medium Risk  . Difficulty of Paying Living Expenses: Somewhat hard  Food Insecurity: No Food Insecurity  . Worried About Programme researcher, broadcasting/film/video in the  Last Year: Never true  . Ran Out of Food in the Last Year: Never true  Transportation Needs: No Transportation Needs  . Lack of Transportation (Medical): No  . Lack of Transportation (Non-Medical): No  Physical Activity: Inactive  . Days of Exercise per Week: 0 days  . Minutes of Exercise per Session: 0 min  Stress: No Stress Concern Present  . Feeling of Stress : Not at all  Social Connections: Moderately Isolated  . Frequency of Communication with Friends and Family: Three times a week  . Frequency of Social Gatherings with Friends and Family: Once a week  . Attends Religious Services: Never  . Active Member of Clubs or Organizations: No  . Attends Banker Meetings: Never  . Marital Status: Married    Family History: The patient's family history includes Hypertension in his father.  ROS:   All other ROS reviewed and negative. Pertinent positives noted in the HPI.     EKGs/Labs/Other Studies Reviewed:   The following studies were personally reviewed by me today:  EKG:  EKG is ordered today.  The ekg ordered today demonstrates atrial fibrillation heart rate 126, no acute ischemic changes or evidence of infarction, and was personally reviewed by me.   TTE 12/17/2017 - Left ventricle: The cavity size was mildly dilated. Systolic  function was normal. The estimated ejection fraction was in the  range of 60% to 65%. Wall motion was normal; there were no  regional wall motion abnormalities. Left ventricular diastolic  function parameters were normal. Mild concentric and moderate  focal basal septal hypertrophy.  - Right ventricle: The cavity size was mildly dilated. Wall  thickness was normal.   Recent Labs: 05/28/2020: ALT 26; BUN 18; Creatinine, Ser 1.48; Hemoglobin 13.3; Platelets 101; Potassium 4.0; Sodium 141   Recent Lipid Panel    Component Value Date/Time   CHOL 152 12/17/2017 0518   TRIG 85 12/17/2017 0518   HDL 34 (L) 12/17/2017 0518    CHOLHDL 4.5 12/17/2017 0518   VLDL 17 12/17/2017 0518   LDLCALC 101 (H) 12/17/2017 0518    Physical Exam:   VS:  BP (!) 145/72   Pulse (!) 126   Ht 6\' 2"  (1.88 m)   Wt (!) 540 lb 3.2 oz (245 kg)   SpO2 94%   BMI 69.36 kg/m    Wt Readings from Last 3 Encounters:  08/24/20 (!) 540 lb 3.2 oz (245 kg)  05/28/20 (!) 536 lb 3.2 oz (243.2 kg)  04/27/20 (!) 531 lb 4.9 oz (241 kg)    General: Morbidly obese Head: Atraumatic, normal size  Eyes: PEERLA, EOMI  Neck: Supple, no JVD Endocrine: No thryomegaly Cardiac: Normal  S1, S2; irregular rhythm, no murmurs rubs or gallops Lungs: Clear to auscultation bilaterally, no wheezing, rhonchi or rales  Abd: Soft, nontender, no hepatomegaly  Ext: 1+ pitting edema, lymphedema noted Musculoskeletal: No deformities, BUE and BLE strength normal and equal Skin: Warm and dry, no rashes   Neuro: Alert and oriented to person, place, time, and situation, CNII-XII grossly intact, no focal deficits  Psych: Normal mood and affect   ASSESSMENT:   Scott Clark is a 40 y.o. male who presents for the following: 1. Preoperative cardiovascular examination   2. Persistent atrial fibrillation (HCC)   3. Obesity, morbid, BMI 50 or higher (HCC)   4. Tobacco abuse     PLAN:   1. Preoperative cardiovascular examination -He will undergo bariatric surgery. Weight is 540 pounds with a BMI of 69. He denies any symptoms but does get short of breath. He does have some lower extremity edema and lymphedema. I think he needs get back on his Lasix. -His EKG shows he is in atrial fibrillation-he has been on this since October 2021. Attempted sinus restoration would not be beneficial due to his morbid obesity. -He denies any exertional chest pain or pressure. He can climb 1 flight of stairs. I do have plans to repeat an echocardiogram for his atrial fibrillation, see below. However, I would recommend he proceed with surgery. Ideally surgery is urgent. Given his weight he  needs to proceed with this and then we will address his atrial fibrillation. I would not recommend a preoperative stress test as he has no symptoms concerning for angina.   2. Persistent atrial fibrillation (HCC) -A. fib diagnosed in May 2019. Was sent home in sinus rhythm but had recurrence. There is an EKG from October 2021 where he was in A. fib. His EKG today shows she is still in A. Fib. -CHADSVASC=1 (hypertension). Anticoagulation not indicated at this time. -Rate a bit high. We will transition his metoprolol tartrate to metoprolol succinate 200 mg daily. He will continue diltiazem extended release 240 mg daily. -I would like to repeat an echocardiogram to make sure nothing is changed. -He is not a candidate for cardioversion. Is not a candidate for ablation. I think his young age has no rhythm control however his wife right now precludes him from aggressive measures. I would like for him to proceed with bariatric surgery on his weight. I will then reevaluate him for rhythm control strategies.  3. Obesity, morbid, BMI 50 or higher (HCC) -Diet exercise recommended. Weight loss recommended.  4. Tobacco abuse -Smoking cessation counseling provided  Disposition: Return in about 6 months (around 02/21/2021).  Medication Adjustments/Labs and Tests Ordered: Current medicines are reviewed at length with the patient today.  Concerns regarding medicines are outlined above.  Orders Placed This Encounter  Procedures  . EKG 12-Lead  . ECHOCARDIOGRAM COMPLETE   Meds ordered this encounter  Medications  . metoprolol succinate (TOPROL-XL) 200 MG 24 hr tablet    Sig: Take 1 tablet (200 mg total) by mouth daily. Take with or immediately following a meal.    Dispense:  90 tablet    Refill:  3    Patient Instructions  Medication Instructions:  Stop Metoprolol Tartrate Start Metoprolol Succinate 200 mg daily   *If you need a refill on your cardiac medications before your next appointment, please  call your pharmacy*   Testing/Procedures: Echocardiogram - Your physician has requested that you have an echocardiogram. Echocardiography is a painless test that uses sound waves to create images  of your heart. It provides your doctor with information about the size and shape of your heart and how well your heart's chambers and valves are working. This procedure takes approximately one hour. There are no restrictions for this procedure. This will be performed at our [facility] location - [address].    Follow-Up: At [facility], you and your health needs are our priority.  As part of our continuing mission to provide you with exceptional heart care, we have created designated Provider Care Teams.  These Care Teams include your primary Cardiologist (physician) and Advanced Practice Providers (APPs -  Physician Assistants and Nurse Practitioners) who all work together to provide you with the care you need, when you need it.  We recommend signing up for the patient portal called "MyChart".  Sign up information is provided on this After Visit Summary.  MyChart is used to connect with patients for Virtual Visits (Telemedicine).  Patients are able to view lab/test results, encounter notes, upcoming appointments, etc.  Non-urgent messages can be sent to your provider as well.   To learn more about what you can do with MyChart, go to [email or web address].    Your next appointment:   6 month(s)  The format for your next appointment:   In Person  Provider:   Lennie Odor, MD       Signed, Lenna Gilford. Flora Lipps, MD [facility]  [address] [place], [place] [address] [phone]  08/24/2020 2:04 PM

## 2020-08-24 ENCOUNTER — Encounter: Payer: Self-pay | Admitting: Cardiovascular Disease

## 2020-08-24 ENCOUNTER — Ambulatory Visit (INDEPENDENT_AMBULATORY_CARE_PROVIDER_SITE_OTHER): Payer: PRIVATE HEALTH INSURANCE | Admitting: Cardiovascular Disease

## 2020-08-24 ENCOUNTER — Other Ambulatory Visit: Payer: Self-pay

## 2020-08-24 VITALS — BP 145/72 | HR 126 | Ht 74.0 in | Wt >= 6400 oz

## 2020-08-24 DIAGNOSIS — I4819 Other persistent atrial fibrillation: Secondary | ICD-10-CM | POA: Diagnosis not present

## 2020-08-24 DIAGNOSIS — Z720 Tobacco use: Secondary | ICD-10-CM

## 2020-08-24 DIAGNOSIS — Z01810 Encounter for preprocedural cardiovascular examination: Secondary | ICD-10-CM | POA: Diagnosis not present

## 2020-08-24 DIAGNOSIS — I480 Paroxysmal atrial fibrillation: Secondary | ICD-10-CM

## 2020-08-24 MED ORDER — METOPROLOL SUCCINATE ER 200 MG PO TB24: 200.0000 mg | ORAL_TABLET | Freq: Every day | ORAL | 3 refills | Status: DC

## 2020-08-24 NOTE — Patient Instructions (Signed)
Medication Instructions:  Stop Metoprolol Tartrate Start Metoprolol Succinate 200 mg daily   *If you need a refill on your cardiac medications before your next appointment, please call your pharmacy*   Testing/Procedures: Echocardiogram - Your physician has requested that you have an echocardiogram. Echocardiography is a painless test that uses sound waves to create images of your heart. It provides your doctor with information about the size and shape of your heart and how well your heart's chambers and valves are working. This procedure takes approximately one hour. There are no restrictions for this procedure. This will be performed at our [facility] location - [address].    Follow-Up: At [facility], you and your health needs are our priority.  As part of our continuing mission to provide you with exceptional heart care, we have created designated Provider Care Teams.  These Care Teams include your primary Cardiologist (physician) and Advanced Practice Providers (APPs -  Physician Assistants and Nurse Practitioners) who all work together to provide you with the care you need, when you need it.  We recommend signing up for the patient portal called "MyChart".  Sign up information is provided on this After Visit Summary.  MyChart is used to connect with patients for Virtual Visits (Telemedicine).  Patients are able to view lab/test results, encounter notes, upcoming appointments, etc.  Non-urgent messages can be sent to your provider as well.   To learn more about what you can do with MyChart, go to [email or web address].    Your next appointment:   6 month(s)  The format for your next appointment:   In Person  Provider:   Lennie Odor, MD

## 2020-09-17 ENCOUNTER — Other Ambulatory Visit: Payer: Self-pay

## 2020-09-17 ENCOUNTER — Ambulatory Visit (HOSPITAL_COMMUNITY): Payer: PRIVATE HEALTH INSURANCE | Attending: Cardiology

## 2020-09-17 DIAGNOSIS — I4819 Other persistent atrial fibrillation: Secondary | ICD-10-CM | POA: Diagnosis present

## 2020-09-17 LAB — ECHOCARDIOGRAM COMPLETE
Area-P 1/2: 3.12 cm2
S' Lateral: 3.3 cm

## 2020-10-01 ENCOUNTER — Encounter: Payer: Self-pay | Admitting: Skilled Nursing Facility1

## 2020-10-01 ENCOUNTER — Encounter: Payer: PRIVATE HEALTH INSURANCE | Attending: General Surgery | Admitting: Skilled Nursing Facility1

## 2020-10-01 ENCOUNTER — Other Ambulatory Visit: Payer: Self-pay

## 2020-10-01 NOTE — Progress Notes (Signed)
Nutrition Assessment for Bariatric Surgery Medical Nutrition Therapy Appt Start Time: 9:25 End Time: 10:25  Patient was seen on 10/01/2020 for Pre-Operative Nutrition Assessment. Letter of approval faxed to [facility] bariatric surgery program coordinator on 10/01/2020  Referral stated Supervised Weight Loss (SWL) visits needed: 0; pt will return for a minimum of one visit to assist pt in moving through the stages of change: pt was advised this visit is not insurance mandated   Planned surgery: RYGB Pt expectation of surgery: to lose weight Pt expectation of dietitian: none stated    NUTRITION ASSESSMENT   Anthropometrics  Start weight at [facility]: 547.9 lbs (date: 10/01/2020)  Height: 74 in BMI: 70.35 kg/m2     Clinical  Medical hx: sleep apnea, HTN Medications: multivitamin, elder berry, vitamin D, vitamin B12, C-PAP Labs:  Notable signs/symptoms:  Any previous deficiencies? vitamin D, vitamin b12  Micronutrient Nutrition Focused Physical Exam: Hair: No issues observed Eyes: No issues observed Mouth: No issues observed Neck: No issues observed Nails: No issues observed Skin: No issues observed  Lifestyle & Dietary Hx  Pt states he is working on quitting smoking currently and vaping currently. Pt states he feels he loses control over the amount he eats about once a week.   24-Hr Dietary Recall: majority of meals eaten out First Meal: oatmeal + 2-3 apple/ornage/banana or 2 jimmy dean cressants Snack: Second Meal: sandwich + 2 granola bars + 2 bags of chips + 3-4 fruits + 2 gogurts  Snack: lunch continued Third Meal: bbq chicken + green beans + mashed potataoes Snack: chips or candy Beverages: soda, diet soda, water, energy drink, orange juice   Estimated Energy Needs Calories: 1800   NUTRITION DIAGNOSIS  Overweight/obesity ([place]-3.3) related to past poor dietary habits and physical inactivity as evidenced by patient w/ planned RYGB surgery following  dietary guidelines for continued weight loss.    NUTRITION INTERVENTION  Nutrition counseling (C-1) and education (E-2) to facilitate bariatric surgery goals.   Pre-Op Goals Reviewed with the Patient . Track food and beverage intake (pen and paper, MyFitness Pal, Baritastic app, etc.) . Make healthy food choices while monitoring portion sizes . Consume 3 meals per day or try to eat every 3-5 hours . Avoid concentrated sugars and fried foods . Keep sugar & fat in the single digits per serving on food labels . Practice CHEWING your food (aim for applesauce consistency) . Practice not drinking 15 minutes before, during, and 30 minutes after each meal and snack . Avoid all carbonated beverages (ex: soda, sparkling beverages)  . Limit caffeinated beverages (ex: coffee, tea, energy drinks) . Avoid all sugar-sweetened beverages (ex: regular soda, sports drinks)  . Avoid alcohol  . Aim for 64-100 ounces of FLUID daily (with at least half of fluid intake being plain water)  . Aim for at least 60-80 grams of PROTEIN daily . Look for a liquid protein source that contains ?15 g protein and ?5 g carbohydrate (ex: shakes, drinks, shots) . Make a list of non-food related activities . Physical activity is an important part of a healthy lifestyle so keep it moving! The goal is to reach 150 minutes of exercise per week, including cardiovascular and weight baring activity. . When your wife makes your dinner have her make your lunch too . Limit fruit to 3-4 servings per day  *Goals that are bolded indicate the pt would like to start working towards these  Handouts Provided Include  . Bariatric Surgery handouts (Nutrition Visits, Pre-Op Goals,  Protein Shakes, Vitamins & Minerals)  Learning Style & Readiness for Change Teaching method utilized: Visual & Auditory  Demonstrated degree of understanding via: Teach Back  Readiness Level: pre contemplative  Barriers to learning/adherence to lifestyle change:  unknown   RD's Notes for Next Visit . Assess pts adherence to chosen goals      MONITORING & EVALUATION Dietary intake, weekly physical activity, body weight, and pre-op goals reached at next nutrition visit.    Next Steps  Patient is to follow up at [facility] for Pre-Op Class >2 weeks before surgery for further nutrition education.

## 2020-10-04 ENCOUNTER — Inpatient Hospital Stay (HOSPITAL_COMMUNITY): Payer: PRIVATE HEALTH INSURANCE | Attending: Hematology

## 2020-10-11 ENCOUNTER — Ambulatory Visit (HOSPITAL_COMMUNITY): Payer: PRIVATE HEALTH INSURANCE | Admitting: Hematology

## 2020-10-15 ENCOUNTER — Encounter: Payer: PRIVATE HEALTH INSURANCE | Admitting: Skilled Nursing Facility1

## 2020-10-15 ENCOUNTER — Other Ambulatory Visit: Payer: Self-pay

## 2020-10-15 NOTE — Progress Notes (Signed)
Supervised Weight Loss Visit Bariatric Nutrition Education  Planned Surgery: RYGB  Pt Expectation of Surgery/ Goals: to lose weight  Pt does not have nutrition clearance at this time. Pt will continue to return for visits until pt feels he is properly prepared for surgery. Dietitian offered an appointment in 2 weeks, but pt felt 2 weeks wasn't long enough to work on changes therefore he preferred a one month follow-up.  NUTRITION ASSESSMENT  Anthropometrics  Start weight at [facility]: 547.9 lbs (date: 10/01/2020)  Today's weight: 549.9 lbs Weight change: +2 lbs (since previous visit) BMI: 70.6 kg/m2    Clinical  Medical hx: sleep apnea, HTN Medications: multivitamin, elder berry, vitamin D, vitamin B12, C-PAP Labs:  Notable signs/symptoms:  Any previous deficiencies? vitamin D, vitamin b12  Lifestyle & Dietary Hx  Pt states it's been a slow process becoming accustomed to changes. Pt states he's been logging his food in a journal which has been impactful in helping him make better eating choices and becoming more concioustenious (states he left this in his car). Pt states he has been monitoring his portion sizes through measuring food as well as checking serving sizes on food label. Pt states he is trying to consume 3 meals a day and 2 snacks a day and tries to keep meals between 4-5 hours with snacks between. Pt states he doesn't feel he is eating about of boredom, he eats because he enjoys food. Pt states he has a hard time consuming appropriate portions of fruit and reports that he uses willpower to try to control his portions. Pt states he eats fruit because he knows that it is a healthier choice, but has a hard time with portion sizes. Pt states that he feels that having a small plate will help with smaller portions. Pt realizes he was jumping too far ahead to success without identifying the changes he would need to make in his life to be successful referring to it as a"blank time or grey  period."  Estimated daily fluid intake: 40 oz of water, 16 oz juice or diet soda Supplements: MVI Current average weekly physical activity: walking 10-15 minutes, 3x/week  24-Hr Dietary Recall First Meal: (6-7 AM) cup of oatmeal (1 tbsp sugar) with raisins or dehydrated apple, 2-3 strips [place] bacon, piece of fruit Snack: (9 AM) piece of fruit Second Meal: sandwich (ham/turkey/bologna or chicken salad), bag of chips, granola bar, 1/2 piece fruit-2 pieces of fruit Snack: 2 pieces of fruit, sandwich (ham/turkey/bologna or chicken salad) with bag of chips Third Meal: roasted chicken + mashed potatoes + green beans or broccoli Snack:  Beverages: orange juice, water, diet soda, sparkling water  Estimated Energy Needs Calories: 1800   NUTRITION DIAGNOSIS  Overweight/obesity ([place]-3.3) related to past poor dietary habits and physical inactivity as evidenced by patient w/ planned RYGB surgery following dietary guidelines for continued weight loss.   NUTRITION INTERVENTION  Nutrition counseling (C-1) and education (E-2) to facilitate bariatric surgery goals.  Encouraged other ways to fill time to prevent eating out of boredom  Discussed eating at preplanned meal times to prevent overeating  Pre-Op Goals Progress & New Goals . Continue: Make healthy food choices while monitoring portion sizes: use a smaller plate . Continue: Consume 3 meals per day or try to eat every 3-5 hours . Continue: When your wife makes your dinner have her make your lunch too . Continue: Limit fruit to 3-4 servings of fruit per day: only bring 4 pieces of fruit to work (new strategy  to work on)  . New: Do not eat in between every 3 hours, set specific meal and snack times  Learning Style & Readiness for Change Teaching method utilized: Visual & Auditory  Demonstrated degree of understanding via: Teach Back  Readiness Level: contemplating Barriers to learning/adherence to lifestyle change: lack of  readiness  RD's Notes for next Visit  Assess pts adherence to chosen goals  Check on smoking cessation   MONITORING & EVALUATION Dietary intake, weekly physical activity, body weight, and pre-op goals in 1 month.   Next Steps  Patient is to return to [facility] in 1 month for continuing dietary change.

## 2020-11-12 ENCOUNTER — Encounter: Payer: PRIVATE HEALTH INSURANCE | Attending: General Surgery | Admitting: Skilled Nursing Facility1

## 2020-11-12 ENCOUNTER — Other Ambulatory Visit: Payer: Self-pay

## 2020-11-12 NOTE — Progress Notes (Signed)
Supervised Weight Loss Visit Bariatric Nutrition Education  Planned Surgery: RYGB  Pt Expectation of Surgery/ Goals: to lose weight  Pt does not have nutrition clearance at this time. Pt decided to have one more follow up in a month.  NUTRITION ASSESSMENT  Anthropometrics  Start weight at [facility]: 547.9 lbs (date: 10/01/2020)  Today's weight: 555 lbs Weight change: +6 lbs (since previous visit) BMI: 71.31 kg/m2    Clinical  Medical hx: sleep apnea, HTN Medications: multivitamin, elder berry, vitamin D, vitamin B12, C-PAP Labs: reviewed EMR, no new notable labs Notable signs/symptoms: none identified Any previous deficiencies? vitamin D, vitamin b12  Lifestyle & Dietary Hx  Pt reports he has been using a smaller plate to eat off of rather than a tupperware container and that he's been eating every 3-5 hours.  He reports success with this.  Pt reports he eats differently and gets less activity on the weekends as there is less structure to the day.  He reports that he would like to work on this.  Pt reports he is a 6.5/10 on a readiness for surgery scale.    Estimated daily fluid intake: 40 oz of water, 16 oz juice or diet soda Supplements: MVI Current average weekly physical activity: walking 10-15 minutes, 3x/week  24-Hr Dietary Recall continued First Meal: (6-7 AM) cup of oatmeal (1 tbsp sugar) with raisins or dehydrated apple, 2-3 strips [place] bacon, piece of fruit Snack: (9 AM) piece of fruit Second Meal: sandwich (ham/turkey/bologna or chicken salad), bag of chips, granola bar, 1/2 piece fruit-2 pieces of fruit Snack: 2 pieces of fruit, sandwich (ham/turkey/bologna or chicken salad) with bag of chips Third Meal: roasted chicken + mashed potatoes + green beans or broccoli Snack:  Beverages: orange juice, water, diet soda, sparkling water  Estimated Energy Needs Calories: 1800   NUTRITION DIAGNOSIS  Overweight/obesity ([place]-3.3) related to past poor dietary habits and  physical inactivity as evidenced by patient w/ planned RYGB surgery following dietary guidelines for continued weight loss.   NUTRITION INTERVENTION  Nutrition counseling (C-1) and education (E-2) to facilitate bariatric surgery goals.  Encouraged other ways to fill time to prevent eating out of boredom  Discussed eating at preplanned meal times to prevent overeating  Pre-Op Goals Progress & New Goals . Continue: Make healthy food choices while monitoring portion sizes: use a smaller plate . Continue: Consume 3 meals per day or try to eat every 3-5 hours . Continue: When your wife makes your dinner have her make your lunch too . Continue: Limit fruit to 3-4 servings of fruit per day: only bring 4 pieces of fruit to work (new strategy to work on)  . Continue: Do not eat in between every 3 hours, set specific meal and snack times . NEW:  Adjust meals to be more balanced (pizza and salad) . NEW:  Eat at table . NEW:  Walk with son on weekends  Learning Style & Readiness for Change Teaching method utilized: Visual & Auditory  Demonstrated degree of understanding via: Teach Back  Readiness Level: contemplating Barriers to learning/adherence to lifestyle change: lack of readiness  RD's Notes for next Visit  Assess pts adherence to chosen goals  Check on smoking cessation   MONITORING & EVALUATION Dietary intake, weekly physical activity, body weight, and pre-op goals in 1 month.   Next Steps  Patient is to return to [facility] in 1 month for continuing behavior change (diet & smoking).

## 2021-01-07 ENCOUNTER — Encounter: Payer: PRIVATE HEALTH INSURANCE | Attending: General Surgery | Admitting: Skilled Nursing Facility1

## 2021-01-07 ENCOUNTER — Other Ambulatory Visit: Payer: Self-pay

## 2021-01-07 NOTE — Progress Notes (Signed)
Supervised Weight Loss Visit Bariatric Nutrition Education  Planned Surgery: RYGB  Pt Expectation of Surgery/ Goals: to lose weight  Pt completed visits.   Pt has cleared nutrition requirements.   NUTRITION ASSESSMENT  Anthropometrics  Start weight at [facility]: 547.9 lbs (date: 10/01/2020)  Today's weight: 557 lbs Weight change: +8 lbs (since previous visit) BMI: 71.51 kg/m2    Clinical  Medical hx: sleep apnea, HTN Medications: multivitamin, elder berry, vitamin D, vitamin B12, C-PAP Labs: reviewed EMR, no new notable labs Notable signs/symptoms: none identified Any previous deficiencies? vitamin D, vitamin b12  Lifestyle & Dietary Hx  Pt states he has been doing a a lot of celebrating which is where the weight gain has come from. Pt states he feels more ready for surgery now. Pt sates his wife has been sick lately which has been hard. Pt state she feels more ready for surgery since paying closer attention to what he has been eating and working out as well as his wife has been supportive.  Pt states he is still currently smoking.   Pt states he believes not smoking after surgery will be the most difficult.   Estimated daily fluid intake: 40 oz of water, 16 oz juice or diet soda Supplements: MVI Current average weekly physical activity: walking 10-15 minutes, 3x/week  24-Hr Dietary Recall continued First Meal: (6-7 AM) 3 [place] bacon + oatmeal + orange juice Snack: (9 AM) piece of fruit Second Meal: chic fila salad sometimes chicken strips to go with it Snack: 2 pieces of fruit, sandwich (ham/turkey/bologna or chicken salad) with bag of chips Third Meal: roasted chicken + mashed potatoes + green beans or broccoli Snack: pack of crackers or granola bar Beverages: orange juice, water, diet soda + brandy or other spirit, sparkling water, sugar free drinks  Estimated Energy Needs Calories: 1800   NUTRITION DIAGNOSIS  Overweight/obesity ([place]-3.3) related to past poor dietary  habits and physical inactivity as evidenced by patient w/ planned RYGB surgery following dietary guidelines for continued weight loss.   NUTRITION INTERVENTION  Nutrition counseling (C-1) and education (E-2) to facilitate bariatric surgery goals. Encouraged other ways to fill time to prevent eating out of boredom Discussed eating at preplanned meal times to prevent overeating  Pre-Op Goals Progress & New Goals Continue: Make healthy food choices while monitoring portion sizes: use a smaller plate Continue: Consume 3 meals per day or try to eat every 3-5 hours Continue: When your wife makes your dinner have her make your lunch too Continue: Limit fruit to 3-4 servings of fruit per day: only bring 4 pieces of fruit to work (new strategy to work on)  Continue: Do not eat in between every 3 hours, set specific meal and snack times continue:  Adjust meals to be more balanced (pizza and salad) continue:  Eat at table continue:  Walk with son on weekends  Learning Style & Readiness for Change Teaching method utilized: Visual & Auditory  Demonstrated degree of understanding via: Teach Back  Readiness Level: contemplating/action Barriers to learning/adherence to lifestyle change: none identified   RD's Notes for next Visit  Assess pts adherence to chosen goals  Check on smoking cessation   MONITORING & EVALUATION Dietary intake, weekly physical activity, body weight, and pre-op goals in 1 month.   Next Steps  Patient is to return to [facility] for pre-op class Pt has completed visits. No further supervised visits required

## 2021-02-18 ENCOUNTER — Encounter: Payer: PRIVATE HEALTH INSURANCE | Attending: General Surgery | Admitting: Skilled Nursing Facility1

## 2021-02-18 ENCOUNTER — Other Ambulatory Visit: Payer: Self-pay

## 2021-02-18 NOTE — Progress Notes (Signed)
Pre-Operative Nutrition Class:    Patient was seen on 02/18/2021 for Pre-Operative Bariatric Surgery Education [facility]    Surgery date:  Surgery type: RYGB Start weight at [facility]: 547.9 Weight today: 568.4  Samples given per MNT protocol. Patient educated on appropriate usage: Protien 20 shake Lot: [id number] Exp: 11/30/21   Bariatric Advantage Calcium Lot # [id number] Exp: 02/23     procare Vitamins Calcium Lot # [id number] Exp: 01/23   Celebrate Protein Powder   Lot # [id number] Exp: 04/23  The following the learning objectives were met by the patient during this course: Identify Pre-Op Dietary Goals and will begin 2 weeks pre-operatively Identify appropriate sources of fluids and proteins  State protein recommendations and appropriate sources pre and post-operatively Identify Post-Operative Dietary Goals and will follow for 2 weeks post-operatively Identify appropriate multivitamin and calcium sources Describe the need for physical activity post-operatively and will follow MD recommendations State when to call healthcare provider regarding medication questions or post-operative complications When having a diagnosis of diabetes understanding hypoglycemia symptoms and the inclusion of 1 complex carbohydrate per meal  Handouts given during class include: Pre-Op Bariatric Surgery Diet Handout Protein Shake Handout Post-Op Bariatric Surgery Nutrition Handout BELT Program Information Flyer Support Group Information Flyer [facility] Bariatric Supplements Price List  Follow-Up Plan: Patient will follow-up at [facility] 2 weeks post operatively for diet advancement per MD.

## 2021-03-21 ENCOUNTER — Ambulatory Visit: Payer: Self-pay | Admitting: General Surgery

## 2021-03-21 NOTE — Progress Notes (Signed)
Sent message, via epic in basket, requesting orders in epic from surgeon.  

## 2021-04-01 ENCOUNTER — Other Ambulatory Visit (HOSPITAL_COMMUNITY): Payer: Self-pay

## 2021-04-02 NOTE — Progress Notes (Signed)
DUE TO COVID-19 ONLY ONE VISITOR IS ALLOWED TO COME WITH YOU AND STAY IN THE WAITING ROOM ONLY DURING PRE OP AND PROCEDURE DAY OF SURGERY.  2 VISITOR  MAY VISIT WITH YOU AFTER SURGERY IN YOUR PRIVATE ROOM DURING VISITING HOURS ONLY!  YOU NEED TO HAVE A COVID 19 TEST ON___0/15/2022 ___@_  @_from  8am-3pm _____, THIS TEST MUST BE DONE BEFORE SURGERY,  Covid test is done at [address] [place], [place] [address].  This is a drive thru.  No appt required. Please see map.                 Your procedure is scheduled on:  04/08/2021   Report to [facility] Main  Entrance   Report to admitting at    [phone]     Call this number if you have problems the morning of surgery [phone]    REMEMBER: NO  SOLID FOOD CANDY OR GUM AFTER MIDNIGHT. CLEAR LIQUIDS UNTIL  0415am         . NOTHING BY MOUTH EXCEPT CLEAR LIQUIDS UNTIL   0415am  . PLEASE FINISH ENSURE DRINK PER SURGEON ORDER  WHICH NEEDS TO BE COMPLETED AT     0415am  .      CLEAR LIQUID DIET   Foods Allowed                                                                    Coffee and tea, regular and decaf                            Fruit ices (not with fruit pulp)                                      Iced Popsicles                                    Carbonated beverages, regular and diet                                    Cranberry, grape and apple juices Sports drinks like Gatorade Lightly seasoned clear broth or consume(fat free) Sugar, honey syrup ___________________________________________________________________      BRUSH YOUR TEETH MORNING OF SURGERY AND RINSE YOUR MOUTH OUT, NO CHEWING GUM CANDY OR MINTS.     Take these medicines the morning of surgery with A SIP OF WATER:  cardizem, toprol  DO NOT TAKE ANY DIABETIC MEDICATIONS DAY OF YOUR SURGERY                               You may not have any metal on your body including hair pins and              piercings  Do not wear jewelry, make-up, lotions,  powders or perfumes, deodorant             Do not wear  nail polish on your fingernails.  Do not shave  48 hours prior to surgery.              Men may shave face and neck.   Do not bring valuables to the hospital. [place].  Contacts, dentures or bridgework may not be worn into surgery.  Leave suitcase in the car. After surgery it may be brought to your room.     Patients discharged the day of surgery will not be allowed to drive home. IF YOU ARE HAVING SURGERY AND GOING HOME THE SAME DAY, YOU MUST HAVE AN ADULT TO DRIVE YOU HOME AND BE WITH YOU FOR 24 HOURS. YOU MAY GO HOME BY TAXI OR UBER OR ORTHERWISE, BUT AN ADULT MUST ACCOMPANY YOU HOME AND STAY WITH YOU FOR 24 HOURS.  Name and phone number of your driver:  Special Instructions: N/A              Please read over the following fact sheets you were given: _____________________________________________________________________  [facility] - Preparing for Surgery Before surgery, you can play an important role.  Because skin is not sterile, your skin needs to be as free of germs as possible.  You can reduce the number of germs on your skin by washing with CHG (chlorahexidine gluconate) soap before surgery.  CHG is an antiseptic cleaner which kills germs and bonds with the skin to continue killing germs even after washing. Please DO NOT use if you have an allergy to CHG or antibacterial soaps.  If your skin becomes reddened/irritated stop using the CHG and inform your nurse when you arrive at Short Stay. Do not shave (including legs and underarms) for at least 48 hours prior to the first CHG shower.  You may shave your face/neck. Please follow these instructions carefully:  1.  Shower with CHG Soap the night before surgery and the  morning of Surgery.  2.  If you choose to wash your hair, wash your hair first as usual with your  normal  shampoo.  3.  After you shampoo, rinse your hair and body  thoroughly to remove the  shampoo.                           4.  Use CHG as you would any other liquid soap.  You can apply chg directly  to the skin and wash                       Gently with a scrungie or clean washcloth.  5.  Apply the CHG Soap to your body ONLY FROM THE NECK DOWN.   Do not use on face/ open                           Wound or open sores. Avoid contact with eyes, ears mouth and genitals (private parts).                       Wash face,  Genitals (private parts) with your normal soap.             6.  Wash thoroughly, paying special attention to the area where your surgery  will be performed.  7.  Thoroughly rinse your body with warm water from  the neck down.  8.  DO NOT shower/wash with your normal soap after using and rinsing off  the CHG Soap.                9.  Pat yourself dry with a clean towel.            10.  Wear clean pajamas.            11.  Place clean sheets on your bed the night of your first shower and do not  sleep with pets. Day of Surgery : Do not apply any lotions/deodorants the morning of surgery.  Please wear clean clothes to the hospital/surgery center.  FAILURE TO FOLLOW THESE INSTRUCTIONS MAY RESULT IN THE CANCELLATION OF YOUR SURGERY PATIENT SIGNATURE_________________________________  NURSE SIGNATURE__________________________________  ________________________________________________________________________

## 2021-04-03 ENCOUNTER — Other Ambulatory Visit: Payer: Self-pay

## 2021-04-03 ENCOUNTER — Encounter (HOSPITAL_COMMUNITY): Payer: Self-pay

## 2021-04-03 ENCOUNTER — Encounter (HOSPITAL_COMMUNITY)
Admission: RE | Admit: 2021-04-03 | Discharge: 2021-04-03 | Disposition: A | Discharge status: Home / Self Care | Payer: PRIVATE HEALTH INSURANCE | Source: Ambulatory Visit | Attending: General Surgery | Admitting: General Surgery

## 2021-04-03 DIAGNOSIS — Z87891 Personal history of nicotine dependence: Secondary | ICD-10-CM | POA: Diagnosis not present

## 2021-04-03 DIAGNOSIS — Z79899 Other long term (current) drug therapy: Secondary | ICD-10-CM | POA: Insufficient documentation

## 2021-04-03 DIAGNOSIS — Z6841 Body Mass Index (BMI) 40.0 and over, adult: Secondary | ICD-10-CM | POA: Diagnosis not present

## 2021-04-03 DIAGNOSIS — I4891 Unspecified atrial fibrillation: Secondary | ICD-10-CM | POA: Diagnosis not present

## 2021-04-03 DIAGNOSIS — G4730 Sleep apnea, unspecified: Secondary | ICD-10-CM | POA: Insufficient documentation

## 2021-04-03 DIAGNOSIS — Z7982 Long term (current) use of aspirin: Secondary | ICD-10-CM | POA: Diagnosis not present

## 2021-04-03 DIAGNOSIS — I10 Essential (primary) hypertension: Secondary | ICD-10-CM | POA: Diagnosis not present

## 2021-04-03 DIAGNOSIS — Z01812 Encounter for preprocedural laboratory examination: Secondary | ICD-10-CM | POA: Insufficient documentation

## 2021-04-03 HISTORY — DX: Unspecified atrial fibrillation: I48.91

## 2021-04-03 LAB — COMPREHENSIVE METABOLIC PANEL
ALT: 30 U/L (ref 0–44)
AST: 26 U/L (ref 15–41)
Albumin: 3.8 g/dL (ref 3.5–5.0)
Alkaline Phosphatase: 66 U/L (ref 38–126)
Anion gap: 8 (ref 5–15)
BUN: 19 mg/dL (ref 6–20)
CO2: 25 mmol/L (ref 22–32)
Calcium: 8.8 mg/dL — ABNORMAL LOW (ref 8.9–10.3)
Chloride: 105 mmol/L (ref 98–111)
Creatinine, Ser: 1.36 mg/dL — ABNORMAL HIGH (ref 0.61–1.24)
GFR, Estimated: >60 mL/min (ref >60)
Glucose, Bld: 78 mg/dL (ref 70–99)
Potassium: 4 mmol/L (ref 3.5–5.1)
Sodium: 138 mmol/L (ref 135–145)
Total Bilirubin: 0.7 mg/dL (ref 0.3–1.2)
Total Protein: 7.5 g/dL (ref 6.5–8.1)

## 2021-04-03 LAB — CBC WITH DIFFERENTIAL/PLATELET
Abs Immature Granulocytes: 0.01 10*3/uL (ref 0.00–0.07)
Basophils Absolute: 0 10*3/uL (ref 0.0–0.1)
Basophils Relative: 1 %
Eosinophils Absolute: 0.2 10*3/uL (ref 0.0–0.5)
Eosinophils Relative: 6 %
HCT: 39.7 % (ref 39.0–52.0)
Hemoglobin: 12.5 g/dL — ABNORMAL LOW (ref 13.0–17.0)
Immature Granulocytes: 0 %
Lymphocytes Relative: 46 %
Lymphs Abs: 1.9 10*3/uL (ref 0.7–4.0)
MCH: 27.6 pg (ref 26.0–34.0)
MCHC: 31.5 g/dL (ref 30.0–36.0)
MCV: 87.6 fL (ref 80.0–100.0)
Monocytes Absolute: 0.3 10*3/uL (ref 0.1–1.0)
Monocytes Relative: 9 %
Neutro Abs: 1.5 10*3/uL — ABNORMAL LOW (ref 1.7–7.7)
Neutrophils Relative %: 38 %
Platelets: 120 10*3/uL — ABNORMAL LOW (ref 150–400)
RBC: 4.53 MIL/uL (ref 4.22–5.81)
RDW: 17.2 % — ABNORMAL HIGH (ref 11.5–15.5)
WBC: 4 10*3/uL (ref 4.0–10.5)
nRBC: 0 % (ref 0.0–0.2)

## 2021-04-03 LAB — PROTIME-INR
INR: 1 (ref 0.8–1.2)
Prothrombin Time: 13.6 seconds (ref 11.4–15.2)

## 2021-04-03 NOTE — Progress Notes (Addendum)
Anesthesia Review:  PCP: DR Linford Arnold - [phone]- Requested LOV note. 10/01/20 on chart.  Cardiologist : DR Gerri Spore O'Neal LOV 08/24/20 for preop clearance  Oncology- Dr Doreatha Massed- thrombocytopenia  Chest x-ray : 08/20/20  EKG : 08/24/20  Echo : 09/17/20  Stress test: Cardiac Cath :  Activity level: can do a flight of stairs without difficulty  Sleep Study/ CPAP : has cpap  Fasting Blood Sugar :      / Checks Blood Sugar -- times a day:   Blood Thinner/ Instructions /Last Dose: ASA / Instructions/ Last Dose :   81 mg Aspirin  Patient reports at preop due to his size ( scales would not accomodate him ) blood pressure is sometimes taken in lower arm or upper arm.  Blood pressure in right lower arm was 117/115 and in left lower arm was 170/118.  In left upper arm blood pressure was 149/91.  PT states he has taken blood pressure meds this am.  PT denies any chest pain, dizzines, headache or lightheadedness.   Covid test on 04/04/21.  CBC done 04/03/21- pltc- 120 - routed to Dr Gaynelle Adu on 04/03/21.

## 2021-04-04 ENCOUNTER — Other Ambulatory Visit: Payer: Self-pay | Admitting: General Surgery

## 2021-04-04 ENCOUNTER — Encounter (HOSPITAL_COMMUNITY): Payer: Self-pay

## 2021-04-04 LAB — SARS CORONAVIRUS 2 (TAT 6-24 HRS): SARS Coronavirus 2: NEGATIVE

## 2021-04-04 NOTE — Progress Notes (Signed)
Anesthesia Chart Review   Case: [id number] Date/Time: 04/08/21 0700   Procedures:      LAPAROSCOPIC ROUX-EN-Y GASTRIC BYPASS WITH UPPER ENDOSCOPY     LAPAROSCOPIC UMBILICAL HERNIA     UPPER GI ENDOSCOPY   Anesthesia type: General   Pre-op diagnosis: MORBID OBESITY   Location: [facility] ROOM 02 / [facility]   Surgeons: Gaynelle Adu, MD       DISCUSSION:40 y.o. former smoker with h/o sleep apnea, HTN, atrial fibrillation, morbid obesity scheduled for above procedure 04/08/2021 with Dr. Gaynelle Adu.   Pt last seen cardiology 08/24/20.  Pt in a-fib with rate of 126bpm at this visit, metoprolol increased to 200mg  daily.  Evaluate DOS. Per OV note, "-He will undergo bariatric surgery. Weight is 540 pounds with a BMI of 69. He denies any symptoms but does get short of breath. He does have some lower extremity edema and lymphedema. I think he needs get back on his Lasix. -His EKG shows he is in atrial fibrillation-he has been on this since October 2021. Attempted sinus restoration would not be beneficial due to his morbid obesity. -He denies any exertional chest pain or pressure. He can climb 1 flight of stairs. I do have plans to repeat an echocardiogram for his atrial fibrillation, see below. However, I would recommend he proceed with surgery. Ideally surgery is urgent. Given his weight he needs to proceed with this and then we will address his atrial fibrillation. I would not recommend a preoperative stress test as he has no symptoms concerning for angina."   Elevated BP at PAT visit, 149/91. Dr. November 2021 made aware, discussed risk of cancellation. Evaluate DOS.   BP Readings from Last 3 Encounters:  04/03/21 (!) 149/91  08/24/20 (!) 145/72  05/28/20 135/86    VS: BP (!) 149/91   Pulse 81   Temp 36.8 C (Oral)   Resp 16   Ht 6\' 2"  (1.88 m)   SpO2 100%   BMI 72.98 kg/m   PROVIDERS: 13/08/21, PA-C is PCP   , MD is Cardiologist  LABS: Labs reviewed: Acceptable for  surgery. (all labs ordered are listed, but only abnormal results are displayed)  Labs Reviewed  CBC WITH DIFFERENTIAL/PLATELET - Abnormal; Notable for the following components:      Result Value   Hemoglobin 12.5 (*)    RDW 17.2 (*)    Platelets 120 (*)    Neutro Abs 1.5 (*)    All other components within normal limits  COMPREHENSIVE METABOLIC PANEL - Abnormal; Notable for the following components:   Creatinine, Ser 1.36 (*)    Calcium 8.8 (*)    All other components within normal limits  PROTIME-INR  TYPE AND SCREEN     IMAGES:   EKG: 08/24/2020 Rate 126 bpm  Atrial fibrillation with RVR  CV: Echo 09/17/2020 IMPRESSIONS     1. Left ventricular ejection fraction, by estimation, is 60 to 65%. The  left ventricle has normal function. The left ventricle has no regional  wall motion abnormalities. There is mild concentric left ventricular  hypertrophy. Left ventricular diastolic  function could not be evaluated.   2. Right ventricular systolic function is normal. The right ventricular  size is normal. Tricuspid regurgitation signal is inadequate for assessing  PA pressure.   3. The mitral valve is normal in structure. No evidence of mitral valve  regurgitation. No evidence of mitral stenosis.   4. The aortic valve is normal in structure. Aortic valve regurgitation is  not visualized. No aortic stenosis is present.   5. The inferior vena cava is normal in size with greater than 50%  respiratory variability, suggesting right atrial pressure of 3 mmHg. Past Medical History:  Diagnosis Date   Abdominal obesity-metabolic syndrome type 3    Bilateral edema of lower extremity    Normal ABI's (09/2015)   Hypertension    Sleep apnea     Past Surgical History:  Procedure Laterality Date   KNEE SURGERY      MEDICATIONS:  aspirin EC 81 MG EC tablet   buPROPion (WELLBUTRIN XL) 300 MG 24 hr tablet   celecoxib (CELEBREX) 200 MG capsule   diltiazem (CARDIZEM CD) 240 MG 24 hr  capsule   metoprolol succinate (TOPROL-XL) 200 MG 24 hr tablet   traMADol-acetaminophen (ULTRACET) 37.5-325 MG tablet   No current facility-administered medications for this encounter.    Jodell Cipro Ward, PA-C [facility]-Surgical Testing [phone]

## 2021-04-04 NOTE — Anesthesia Preprocedure Evaluation (Addendum)
Anesthesia Evaluation  Patient identified by MRN, date of birth, ID band Patient awake    Reviewed: Allergy & Precautions, NPO status , Patient's Chart, lab work & pertinent test results  History of Anesthesia Complications (+) MALIGNANT HYPERTHERMIA  Airway Mallampati: III  TM Distance: >3 FB Neck ROM: Full    Dental no notable dental hx. (+) Teeth Intact, Dental Advisory Given   Pulmonary sleep apnea and Continuous Positive Airway Pressure Ventilation , former smoker,    Pulmonary exam normal breath sounds clear to auscultation       Cardiovascular hypertension, Pt. on medications and Pt. on home beta blockers Normal cardiovascular exam+ dysrhythmias Atrial Fibrillation  Rhythm:Irregular Rate:Abnormal  Pt in Afib  09/17/20 Echo  1. Left ventricular ejection fraction, by estimation, is 60 to 65%. The  left ventricle has normal function. The left ventricle has no regional  wall motion abnormalities. There is mild concentric left ventricular  hypertrophy. Left ventricular diastolic  function could not be evaluated.  2. Right ventricular systolic function is normal. The right ventricular  size is normal. Tricuspid regurgitation signal is inadequate for assessing  PA pressure.  3. The mitral valve is normal in structure. No evidence of mitral valve  regurgitation. No evidence of mitral stenosis.  4. The aortic valve is normal in structure. Aortic valve regurgitation is  not visualized. No aortic stenosis is present.  5. The inferior vena cava is normal in size with greater than 50%  respiratory variability, suggesting right atrial pressure of 3 mmHg.    Neuro/Psych negative neurological ROS  negative psych ROS   GI/Hepatic negative GI ROS, Neg liver ROS,   Endo/Other  Morbid obesity (BMI 72.9)  Renal/GU Renal InsufficiencyRenal diseaseLab Results      Component                Value               Date                       CREATININE               1.36 (H)            04/03/2021                BUN                      19                  04/03/2021                NA                       138                 04/03/2021                K                        4.0                 04/03/2021                CL                       105  04/03/2021                CO2                      25                  04/03/2021                Musculoskeletal negative musculoskeletal ROS (+)   Abdominal (+) + obese,   Peds  Hematology  (+) Blood dyscrasia (thrombocytopenia), , Lab Results      Component                Value               Date                      WBC                      4.0                 04/03/2021                HGB                      12.5 (L)            04/03/2021                HCT                      39.7                04/03/2021                MCV                      87.6                04/03/2021                PLT                      120 (L)             04/03/2021              Anesthesia Other Findings   Reproductive/Obstetrics                          Anesthesia Physical Anesthesia Plan  ASA: 4  Anesthesia Plan: General   Post-op Pain Management:    Induction: Intravenous  PONV Risk Score and Plan: 3 and Treatment may vary due to age or medical condition, Midazolam, Ondansetron and Dexamethasone  Airway Management Planned: Oral ETT and Video Laryngoscope Planned  Additional Equipment: None  Intra-op Plan:   Post-operative Plan: Extubation in OR  Informed Consent: I have reviewed the patients History and Physical, chart, labs and discussed the procedure including the risks, benefits and alternatives for the proposed anesthesia with the patient or authorized representative who has indicated his/her understanding and acceptance.     Dental advisory given  Plan Discussed with: CRNA and Anesthesiologist  Anesthesia Plan  Comments: (See PAT note 04/03/2021, Jodell Cipro Ward, PA-C  GA w lidocaine infusion + ketamine 2 (18g or largerIV))     Anesthesia Quick Evaluation

## 2021-04-05 ENCOUNTER — Encounter (HOSPITAL_COMMUNITY): Payer: Self-pay | Admitting: General Surgery

## 2021-04-07 NOTE — H&P (Signed)
PROVIDER: Tennile Styles Sherril Cong, MD  MRN: [id number] DOB: 1981-04-10 DATE OF ENCOUNTER: 03/29/2021 Subjective   Chief Complaint: Pre-op Exam   History of Present Illness: Scott Clark is a 40 y.o. male who is seen today for follow-up for his severe obesity, hypertension, bilateral chronic knee disease, bilateral central low back pain, stable thrombocytopenia, chronic kidney disease. He has completed the bariatric surgery evaluation process. He received psychological and nutritional clearance. He also underwent cardiology and hematology evaluation.. A normal echocardiogram. He received hematology clearance as well since his platelet count was above 100,000. Urine nicotine negative. Chest x-ray and upper GI unremarkable.  Denies any chest pain, shortness of breath at rest. He does get some dyspnea exertion. No nosebleeds. No melena medic easier. No easy bruising. Denies tobacco usev  Hemoglobin A1c in January was 5.9. Total cholesterol 141, triglycerides 110, HDL 38, LDL 83  Review of Systems: A complete review of systems was obtained from the patient. I have reviewed this information and discussed as appropriate with the patient. See HPI as well for other ROS.  ROS   Medical History: Past Medical History:  Diagnosis Date   Atrial fibrillation (CMS-HCC)   Chronic kidney disease   Hypertension   Sleep apnea   Patient Active Problem List  Diagnosis   Severe sleep apnea   Thrombocytopenia (CMS-HCC)   History reviewed. No pertinent surgical history.   No Known Allergies  Current Outpatient Medications on File Prior to Visit  Medication Sig Dispense Refill   buPROPion (WELLBUTRIN XL) 300 MG XL tablet   celecoxib (CELEBREX) 200 MG capsule   diltiazem (CARDIZEM CD) 240 MG CD capsule Take 240 mg by mouth once daily   metoprolol succinate (TOPROL-XL) 200 MG XL tablet   traMADol-acetaminophen (ULTRACET) 37.5-325 mg tablet TAKE 1 TO 2 TABLETS BY MOUTH EVERY 8 HOURS FOR UP TO 5 DAYS AS  NEEDED. DO NOT EXCEED 8 TABLETS IN 24 HOURS   No current facility-administered medications on file prior to visit.   Family History  Problem Relation Age of Onset   Diabetes Brother    Social History   Tobacco Use  Smoking Status Former Smoker  Smokeless Tobacco Never Used    Social History   Socioeconomic History   Marital status: Unknown  Tobacco Use   Smoking status: Former Smoker   Smokeless tobacco: Never Used  Substance and Sexual Activity   Alcohol use: Never   Drug use: Never   Objective:   Vitals:  03/29/21 1404  BP: (!) 164/98  Pulse: (!) 112  Temp: 36.7 C (98.1 F)  SpO2: 97%  Weight: (!) 250.2 kg (551 lb 9.6 oz)  Height: 188 cm (6\' 2" )   Body mass index is 70.82 kg/m.  Gen: alert, NAD, non-toxic appearing; severe obesity Pupils: equal, no scleral icterus Pulm: Lungs clear to auscultation, symmetric chest rise CV: regular rate and rhythm Abd: soft, nontender, nondistended. Chronically incarcerated umbilical hernia with some denuded skin. no cellulitis. No incisional hernia Ext: Some chronic lower extremity leg swelling,  Skin: no rash, no jaundice  Labs, Imaging and Diagnostic Testing:  Discussed above  Assessment and Plan:  Diagnoses and all orders for this visit:  Severe obesity (CMS-HCC)  Severe sleep apnea  Thrombocytopenia (CMS-HCC)  Essential hypertension  Bilateral chronic knee pain  Chronic midline low back pain without sciatica  Incarcerated umbilical hernia  Prediabetes  Chronic kidney disease, unspecified CKD stage  Low HDL (under 40)  Persistent atrial fibrillation (CMS-HCC)    Patient has completed  the bariatric surgery evaluation process. He has attended his preoperative education class. We reviewed his work-up. We discussed the critical importance of his compliance with the preoperative meal plan in order to shrink his liver in order to technically performed the surgery. We also discussed management of his  incarcerated umbilical hernia during surgery. I told him I may just amputate the herniated fat and that we would not have to deal with a fascial defect that would need to be closed. We did discuss that should I have to reduce the omentum we would need to close the fascia but could not use mesh because of the risk of infection and that more than likely the hernia recurred. We discussed the typical hospitalization. We discussed that he is at increased risk for length of stay, readmission, blood clots because of his numerous comorbidities. I explained that he would go out on extended chemical DVT prophylaxis since his risk of blood clot extends out for 1 month. I offered to rediscuss the steps of the procedure but he declined. We had an extensive conversation back in January. He reviewed the surgical consent form and signed it. I also discussed the typical things that we see in the first few weeks after surgery.  All of his questions were asked and answered  This patient encounter took 35 minutes today to perform the following: take history, perform exam, review outside records, interpret imaging, counsel the patient on their diagnosis and document encounter, findings & plan in the EHR  No follow-ups on file.  Mary Sella. Andrey Campanile, MD, FACS General, Bariatric, & Minimally Invasive Surgery [facility], [place]

## 2021-04-08 ENCOUNTER — Encounter (HOSPITAL_COMMUNITY): Payer: Self-pay | Admitting: General Surgery

## 2021-04-08 ENCOUNTER — Inpatient Hospital Stay (HOSPITAL_COMMUNITY)
Admission: RE | Admit: 2021-04-08 | Discharge: 2021-04-10 | DRG: 620 | Disposition: A | Discharge status: Home / Self Care | Payer: PRIVATE HEALTH INSURANCE | Source: Ambulatory Visit | Attending: General Surgery | Admitting: General Surgery

## 2021-04-08 ENCOUNTER — Encounter (HOSPITAL_COMMUNITY)
Admission: RE | Disposition: A | Discharge status: Home / Self Care | Payer: Self-pay | Source: Ambulatory Visit | Attending: General Surgery

## 2021-04-08 ENCOUNTER — Other Ambulatory Visit (HOSPITAL_COMMUNITY): Payer: Self-pay

## 2021-04-08 ENCOUNTER — Inpatient Hospital Stay (HOSPITAL_COMMUNITY): Payer: PRIVATE HEALTH INSURANCE | Admitting: Anesthesiology

## 2021-04-08 ENCOUNTER — Other Ambulatory Visit: Payer: Self-pay

## 2021-04-08 ENCOUNTER — Inpatient Hospital Stay (HOSPITAL_COMMUNITY): Payer: PRIVATE HEALTH INSURANCE | Admitting: Physician Assistant

## 2021-04-08 DIAGNOSIS — M25562 Pain in left knee: Secondary | ICD-10-CM | POA: Diagnosis present

## 2021-04-08 DIAGNOSIS — I4819 Other persistent atrial fibrillation: Secondary | ICD-10-CM | POA: Diagnosis present

## 2021-04-08 DIAGNOSIS — M545 Low back pain, unspecified: Secondary | ICD-10-CM | POA: Diagnosis present

## 2021-04-08 DIAGNOSIS — I129 Hypertensive chronic kidney disease with stage 1 through stage 4 chronic kidney disease, or unspecified chronic kidney disease: Secondary | ICD-10-CM | POA: Diagnosis present

## 2021-04-08 DIAGNOSIS — G4730 Sleep apnea, unspecified: Secondary | ICD-10-CM | POA: Diagnosis present

## 2021-04-08 DIAGNOSIS — Z79899 Other long term (current) drug therapy: Secondary | ICD-10-CM | POA: Diagnosis not present

## 2021-04-08 DIAGNOSIS — K420 Umbilical hernia with obstruction, without gangrene: Secondary | ICD-10-CM | POA: Diagnosis present

## 2021-04-08 DIAGNOSIS — Z833 Family history of diabetes mellitus: Secondary | ICD-10-CM

## 2021-04-08 DIAGNOSIS — R7303 Prediabetes: Secondary | ICD-10-CM | POA: Diagnosis present

## 2021-04-08 DIAGNOSIS — Z87891 Personal history of nicotine dependence: Secondary | ICD-10-CM | POA: Diagnosis not present

## 2021-04-08 DIAGNOSIS — D696 Thrombocytopenia, unspecified: Secondary | ICD-10-CM | POA: Diagnosis present

## 2021-04-08 DIAGNOSIS — Z6841 Body Mass Index (BMI) 40.0 and over, adult: Secondary | ICD-10-CM

## 2021-04-08 DIAGNOSIS — Z9884 Bariatric surgery status: Secondary | ICD-10-CM

## 2021-04-08 DIAGNOSIS — G8929 Other chronic pain: Secondary | ICD-10-CM | POA: Diagnosis present

## 2021-04-08 DIAGNOSIS — M25561 Pain in right knee: Secondary | ICD-10-CM | POA: Diagnosis present

## 2021-04-08 DIAGNOSIS — N189 Chronic kidney disease, unspecified: Secondary | ICD-10-CM | POA: Diagnosis present

## 2021-04-08 HISTORY — PX: UMBILICAL HERNIA REPAIR: SHX196

## 2021-04-08 HISTORY — PX: UPPER GI ENDOSCOPY: SHX6162

## 2021-04-08 HISTORY — PX: GASTRIC ROUX-EN-Y: SHX5262

## 2021-04-08 LAB — HEMOGLOBIN AND HEMATOCRIT, BLOOD
HCT: 43.9 % (ref 39.0–52.0)
Hemoglobin: 13.7 g/dL (ref 13.0–17.0)

## 2021-04-08 LAB — HEMOGLOBIN A1C
Hgb A1c MFr Bld: 5.1 % (ref 4.8–5.6)
Mean Plasma Glucose: 99.67 mg/dL

## 2021-04-08 LAB — TYPE AND SCREEN
ABO/RH(D): O POS
Antibody Screen: NEGATIVE

## 2021-04-08 LAB — GLUCOSE, CAPILLARY
Glucose-Capillary: 116 mg/dL — ABNORMAL HIGH (ref 70–99)
Glucose-Capillary: 105 mg/dL — ABNORMAL HIGH (ref 70–99)

## 2021-04-08 LAB — ABO/RH: ABO/RH(D): O POS

## 2021-04-08 LAB — APTT: aPTT: 28 seconds (ref 24–36)

## 2021-04-08 SURGERY — LAPAROSCOPIC ROUX-EN-Y GASTRIC BYPASS WITH UPPER ENDOSCOPY
Anesthesia: General | Site: Abdomen

## 2021-04-08 MED ORDER — OXYCODONE HCL 5 MG PO TABS: 5.0000 mg | ORAL_TABLET | Freq: Once | ORAL | Status: DC | PRN

## 2021-04-08 MED ORDER — LABETALOL HCL 5 MG/ML IV SOLN
10.0000 mg | Freq: Once | INTRAVENOUS | Status: AC
  Administered 2021-04-08: 10 mg via INTRAVENOUS

## 2021-04-08 MED ORDER — BUPIVACAINE LIPOSOME 1.3 % IJ SUSP
INTRAMUSCULAR | Status: AC
  Filled 2021-04-08: qty 20

## 2021-04-08 MED ORDER — HYDRALAZINE HCL 20 MG/ML IJ SOLN
10.0000 mg | INTRAMUSCULAR | Status: DC | PRN
  Administered 2021-04-08: 10 mg via INTRAVENOUS
  Filled 2021-04-08: qty 1

## 2021-04-08 MED ORDER — PANTOPRAZOLE SODIUM 40 MG IV SOLR
40.0000 mg | Freq: Every day | INTRAVENOUS | Status: DC
  Administered 2021-04-08 – 2021-04-09 (×2): 40 mg via INTRAVENOUS
  Filled 2021-04-08 (×2): qty 40

## 2021-04-08 MED ORDER — METOPROLOL SUCCINATE ER 50 MG PO TB24
200.0000 mg | ORAL_TABLET | Freq: Every day | ORAL | Status: DC
  Administered 2021-04-08: 200 mg via ORAL
  Filled 2021-04-08: qty 4

## 2021-04-08 MED ORDER — SUCCINYLCHOLINE CHLORIDE 200 MG/10ML IV SOSY
PREFILLED_SYRINGE | INTRAVENOUS | Status: DC | PRN
  Administered 2021-04-08: 200 mg via INTRAVENOUS

## 2021-04-08 MED ORDER — OXYCODONE HCL 5 MG/5ML PO SOLN: 5.0000 mg | Freq: Once | ORAL | Status: DC | PRN

## 2021-04-08 MED ORDER — PHENYLEPHRINE HCL-NACL 20-0.9 MG/250ML-% IV SOLN
INTRAVENOUS | Status: AC
  Filled 2021-04-08: qty 500

## 2021-04-08 MED ORDER — OXYCODONE HCL 5 MG PO TABS
5.0000 mg | ORAL_TABLET | Freq: Once | ORAL | Status: DC | PRN
Start: 2021-04-08 — End: 2021-04-08

## 2021-04-08 MED ORDER — SODIUM CHLORIDE 0.9 % IV SOLN
INTRAVENOUS | Status: AC | PRN
  Administered 2021-04-08: 50 mL

## 2021-04-08 MED ORDER — FIBRIN SEALANT 2 ML SINGLE DOSE KIT
2.0000 mL | PACK | Freq: Once | CUTANEOUS | Status: AC
  Administered 2021-04-08: 2 mL via TOPICAL
  Filled 2021-04-08: qty 2

## 2021-04-08 MED ORDER — SIMETHICONE 80 MG PO CHEW: 80.0000 mg | CHEWABLE_TABLET | Freq: Four times a day (QID) | ORAL | Status: DC | PRN

## 2021-04-08 MED ORDER — LABETALOL HCL 5 MG/ML IV SOLN
INTRAVENOUS | Status: AC
  Administered 2021-04-08: 10 mg via INTRAVENOUS
  Filled 2021-04-08: qty 4

## 2021-04-08 MED ORDER — POTASSIUM CHLORIDE IN NACL 20-0.45 MEQ/L-% IV SOLN
INTRAVENOUS | Status: DC
  Filled 2021-04-08 (×6): qty 1000

## 2021-04-08 MED ORDER — STERILE WATER FOR IRRIGATION IR SOLN
Status: DC | PRN
  Administered 2021-04-08: 1000 mL

## 2021-04-08 MED ORDER — DILTIAZEM HCL ER COATED BEADS 240 MG PO CP24: 240.0000 mg | ORAL_CAPSULE | Freq: Every day | ORAL | Status: DC

## 2021-04-08 MED ORDER — ONDANSETRON HCL 4 MG/2ML IJ SOLN
INTRAMUSCULAR | Status: AC
  Filled 2021-04-08: qty 4

## 2021-04-08 MED ORDER — KETAMINE HCL 10 MG/ML IJ SOLN
INTRAMUSCULAR | Status: DC | PRN
  Administered 2021-04-08: 20 mg via INTRAVENOUS
  Administered 2021-04-08: 30 mg via INTRAVENOUS

## 2021-04-08 MED ORDER — ACETAMINOPHEN 160 MG/5ML PO SOLN
1000.0000 mg | Freq: Three times a day (TID) | ORAL | Status: DC
  Administered 2021-04-08 – 2021-04-09 (×2): 1000 mg via ORAL
  Filled 2021-04-08 (×3): qty 40.6

## 2021-04-08 MED ORDER — SODIUM CHLORIDE 0.9 % IV SOLN
12.5000 mg | Freq: Four times a day (QID) | INTRAVENOUS | Status: DC | PRN
  Filled 2021-04-08: qty 0.5

## 2021-04-08 MED ORDER — HYDROMORPHONE HCL 1 MG/ML IJ SOLN
INTRAMUSCULAR | Status: AC
  Administered 2021-04-08: 0.5 mg via INTRAVENOUS
  Filled 2021-04-08: qty 1

## 2021-04-08 MED ORDER — BUPIVACAINE LIPOSOME 1.3 % IJ SUSP
20.0000 mL | Freq: Once | INTRAMUSCULAR | Status: AC
  Administered 2021-04-08: 20 mL

## 2021-04-08 MED ORDER — ACETAMINOPHEN 500 MG PO TABS
1000.0000 mg | ORAL_TABLET | ORAL | Status: AC
  Administered 2021-04-08: 1000 mg via ORAL
  Filled 2021-04-08: qty 2

## 2021-04-08 MED ORDER — GABAPENTIN 300 MG PO CAPS
300.0000 mg | ORAL_CAPSULE | ORAL | Status: AC
  Administered 2021-04-08: 300 mg via ORAL
  Filled 2021-04-08: qty 1

## 2021-04-08 MED ORDER — AMISULPRIDE (ANTIEMETIC) 5 MG/2ML IV SOLN: 10.0000 mg | Freq: Once | INTRAVENOUS | Status: DC | PRN

## 2021-04-08 MED ORDER — DEXMEDETOMIDINE (PRECEDEX) IN NS 20 MCG/5ML (4 MCG/ML) IV SYRINGE
PREFILLED_SYRINGE | INTRAVENOUS | Status: DC | PRN
  Administered 2021-04-08: 20 ug via INTRAVENOUS

## 2021-04-08 MED ORDER — DEXAMETHASONE SODIUM PHOSPHATE 10 MG/ML IJ SOLN
INTRAMUSCULAR | Status: AC
  Filled 2021-04-08: qty 2

## 2021-04-08 MED ORDER — KETAMINE HCL 10 MG/ML IJ SOLN
INTRAMUSCULAR | Status: AC
  Filled 2021-04-08: qty 1

## 2021-04-08 MED ORDER — SCOPOLAMINE 1 MG/3DAYS TD PT72
1.0000 | MEDICATED_PATCH | TRANSDERMAL | Status: DC
  Filled 2021-04-08: qty 1

## 2021-04-08 MED ORDER — ROCURONIUM BROMIDE 10 MG/ML (PF) SYRINGE
PREFILLED_SYRINGE | INTRAVENOUS | Status: DC | PRN
  Administered 2021-04-08: 20 mg via INTRAVENOUS
  Administered 2021-04-08 (×3): 30 mg via INTRAVENOUS
  Administered 2021-04-08: 100 mg via INTRAVENOUS
  Administered 2021-04-08: 50 mg via INTRAVENOUS

## 2021-04-08 MED ORDER — ONDANSETRON HCL 4 MG/2ML IJ SOLN
INTRAMUSCULAR | Status: AC
  Filled 2021-04-08: qty 2

## 2021-04-08 MED ORDER — LACTATED RINGERS IV SOLN: INTRAVENOUS | Status: DC | PRN

## 2021-04-08 MED ORDER — HYDRALAZINE HCL 20 MG/ML IJ SOLN: 10.0000 mg | Freq: Once | INTRAMUSCULAR | Status: AC

## 2021-04-08 MED ORDER — SUGAMMADEX SODIUM 500 MG/5ML IV SOLN
INTRAVENOUS | Status: DC | PRN
  Administered 2021-04-08: 500 mg via INTRAVENOUS

## 2021-04-08 MED ORDER — ACETAMINOPHEN 10 MG/ML IV SOLN: 1000.0000 mg | Freq: Once | INTRAVENOUS | Status: DC | PRN

## 2021-04-08 MED ORDER — LABETALOL HCL 5 MG/ML IV SOLN: 10.0000 mg | Freq: Once | INTRAVENOUS | Status: AC

## 2021-04-08 MED ORDER — ROCURONIUM BROMIDE 10 MG/ML (PF) SYRINGE
PREFILLED_SYRINGE | INTRAVENOUS | Status: AC
  Filled 2021-04-08: qty 10

## 2021-04-08 MED ORDER — FENTANYL CITRATE (PF) 250 MCG/5ML IJ SOLN
INTRAMUSCULAR | Status: AC
  Filled 2021-04-08: qty 5

## 2021-04-08 MED ORDER — MIDAZOLAM HCL 2 MG/2ML IJ SOLN
INTRAMUSCULAR | Status: AC
  Filled 2021-04-08: qty 2

## 2021-04-08 MED ORDER — DEXAMETHASONE SODIUM PHOSPHATE 4 MG/ML IJ SOLN: 4.0000 mg | INTRAMUSCULAR | Status: DC

## 2021-04-08 MED ORDER — ENOXAPARIN SODIUM 40 MG/0.4ML IJ SOSY
40.0000 mg | PREFILLED_SYRINGE | Freq: Two times a day (BID) | INTRAMUSCULAR | Status: DC
  Administered 2021-04-08 – 2021-04-10 (×4): 40 mg via SUBCUTANEOUS
  Filled 2021-04-08 (×4): qty 0.4

## 2021-04-08 MED ORDER — LIDOCAINE HCL 2 % IJ SOLN
INTRAMUSCULAR | Status: AC
  Filled 2021-04-08: qty 20

## 2021-04-08 MED ORDER — HYDRALAZINE HCL 20 MG/ML IJ SOLN
INTRAMUSCULAR | Status: AC
  Administered 2021-04-08: 10 mg via INTRAVENOUS
  Filled 2021-04-08: qty 1

## 2021-04-08 MED ORDER — HYDROMORPHONE HCL 1 MG/ML IJ SOLN: 0.2500 mg | INTRAMUSCULAR | Status: DC | PRN

## 2021-04-08 MED ORDER — SUCCINYLCHOLINE CHLORIDE 200 MG/10ML IV SOSY
PREFILLED_SYRINGE | INTRAVENOUS | Status: AC
  Filled 2021-04-08: qty 10

## 2021-04-08 MED ORDER — MIDAZOLAM HCL 5 MG/5ML IJ SOLN
INTRAMUSCULAR | Status: DC | PRN
  Administered 2021-04-08: 2 mg via INTRAVENOUS

## 2021-04-08 MED ORDER — DEXAMETHASONE SODIUM PHOSPHATE 10 MG/ML IJ SOLN
INTRAMUSCULAR | Status: AC
  Filled 2021-04-08: qty 1

## 2021-04-08 MED ORDER — INSULIN ASPART 100 UNIT/ML IJ SOLN: 0.0000 [IU] | INTRAMUSCULAR | Status: DC

## 2021-04-08 MED ORDER — HYDRALAZINE HCL 20 MG/ML IJ SOLN
10.0000 mg | Freq: Once | INTRAMUSCULAR | Status: AC
  Administered 2021-04-08: 10 mg via INTRAVENOUS

## 2021-04-08 MED ORDER — CHLORHEXIDINE GLUCONATE 4 % EX LIQD: 60.0000 mL | Freq: Once | CUTANEOUS | Status: DC

## 2021-04-08 MED ORDER — ACETAMINOPHEN 500 MG PO TABS
1000.0000 mg | ORAL_TABLET | Freq: Three times a day (TID) | ORAL | Status: DC
  Administered 2021-04-09 – 2021-04-10 (×4): 1000 mg via ORAL
  Filled 2021-04-08 (×4): qty 2

## 2021-04-08 MED ORDER — FENTANYL CITRATE (PF) 100 MCG/2ML IJ SOLN
INTRAMUSCULAR | Status: DC | PRN
  Administered 2021-04-08: 250 ug via INTRAVENOUS

## 2021-04-08 MED ORDER — DEXAMETHASONE SODIUM PHOSPHATE 10 MG/ML IJ SOLN
INTRAMUSCULAR | Status: DC | PRN
  Administered 2021-04-08: 10 mg via INTRAVENOUS

## 2021-04-08 MED ORDER — LACTATED RINGERS IR SOLN
Status: DC | PRN
  Administered 2021-04-08: 1000 mL

## 2021-04-08 MED ORDER — PROPOFOL 10 MG/ML IV BOLUS
INTRAVENOUS | Status: DC | PRN
  Administered 2021-04-08: 200 mg via INTRAVENOUS

## 2021-04-08 MED ORDER — DIPHENHYDRAMINE HCL 50 MG/ML IJ SOLN: 12.5000 mg | Freq: Three times a day (TID) | INTRAMUSCULAR | Status: DC | PRN

## 2021-04-08 MED ORDER — "VISTASEAL 4 ML SINGLE DOSE KIT "
4.0000 mL | PACK | Freq: Once | CUTANEOUS | Status: AC
  Administered 2021-04-08: 4 mL via TOPICAL
  Filled 2021-04-08: qty 4

## 2021-04-08 MED ORDER — ONDANSETRON HCL 4 MG/2ML IJ SOLN: 4.0000 mg | Freq: Once | INTRAMUSCULAR | Status: DC | PRN

## 2021-04-08 MED ORDER — OXYCODONE HCL 5 MG/5ML PO SOLN: 5.0000 mg | Freq: Four times a day (QID) | ORAL | Status: DC | PRN

## 2021-04-08 MED ORDER — 0.9 % SODIUM CHLORIDE (POUR BTL) OPTIME
TOPICAL | Status: DC | PRN
  Administered 2021-04-08: 1000 mL

## 2021-04-08 MED ORDER — HEPARIN SODIUM (PORCINE) 5000 UNIT/ML IJ SOLN
5000.0000 [IU] | INTRAMUSCULAR | Status: AC
  Administered 2021-04-08: 5000 [IU] via SUBCUTANEOUS
  Filled 2021-04-08: qty 1

## 2021-04-08 MED ORDER — ONDANSETRON HCL 4 MG/2ML IJ SOLN
4.0000 mg | Freq: Once | INTRAMUSCULAR | Status: AC
  Administered 2021-04-08: 4 mg via INTRAVENOUS

## 2021-04-08 MED ORDER — ONDANSETRON HCL 4 MG/2ML IJ SOLN: 4.0000 mg | Freq: Four times a day (QID) | INTRAMUSCULAR | Status: DC | PRN

## 2021-04-08 MED ORDER — PROPOFOL 10 MG/ML IV BOLUS
INTRAVENOUS | Status: AC
  Filled 2021-04-08: qty 60

## 2021-04-08 MED ORDER — AMISULPRIDE (ANTIEMETIC) 5 MG/2ML IV SOLN
INTRAVENOUS | Status: AC
  Administered 2021-04-08: 10 mg via INTRAVENOUS
  Filled 2021-04-08: qty 4

## 2021-04-08 MED ORDER — AMISULPRIDE (ANTIEMETIC) 5 MG/2ML IV SOLN: 10.0000 mg | Freq: Once | INTRAVENOUS | Status: AC | PRN

## 2021-04-08 MED ORDER — ENSURE MAX PROTEIN PO LIQD
2.0000 [oz_av] | ORAL | Status: DC
  Administered 2021-04-09 – 2021-04-10 (×10): 2 [oz_av] via ORAL

## 2021-04-08 MED ORDER — LIDOCAINE HCL (PF) 2 % IJ SOLN
INTRAMUSCULAR | Status: AC
  Filled 2021-04-08: qty 10

## 2021-04-08 MED ORDER — APREPITANT 40 MG PO CAPS
40.0000 mg | ORAL_CAPSULE | ORAL | Status: AC
  Administered 2021-04-08: 40 mg via ORAL
  Filled 2021-04-08: qty 1

## 2021-04-08 MED ORDER — ONDANSETRON HCL 4 MG/2ML IJ SOLN
INTRAMUSCULAR | Status: DC | PRN
  Administered 2021-04-08: 4 mg via INTRAVENOUS

## 2021-04-08 MED ORDER — LIDOCAINE 2% (20 MG/ML) 5 ML SYRINGE
INTRAMUSCULAR | Status: DC | PRN
  Administered 2021-04-08: 100 mg via INTRAVENOUS
  Administered 2021-04-08: .5 mg/kg/h via INTRAVENOUS

## 2021-04-08 MED ORDER — PHENYLEPHRINE HCL-NACL 20-0.9 MG/250ML-% IV SOLN
INTRAVENOUS | Status: DC | PRN
  Administered 2021-04-08: 40 ug/min via INTRAVENOUS

## 2021-04-08 MED ORDER — SODIUM CHLORIDE 0.9 % IV SOLN
2.0000 g | INTRAVENOUS | Status: AC
  Administered 2021-04-08: 2 g via INTRAVENOUS
  Filled 2021-04-08: qty 2

## 2021-04-08 MED ORDER — SODIUM CHLORIDE (PF) 0.9 % IJ SOLN
INTRAMUSCULAR | Status: AC
  Filled 2021-04-08: qty 10

## 2021-04-08 MED ORDER — HYDROMORPHONE HCL 1 MG/ML IJ SOLN
0.2500 mg | INTRAMUSCULAR | Status: DC | PRN
  Administered 2021-04-08 (×2): 0.5 mg via INTRAVENOUS

## 2021-04-08 MED ORDER — MORPHINE SULFATE (PF) 2 MG/ML IV SOLN: 1.0000 mg | INTRAVENOUS | Status: DC | PRN

## 2021-04-08 SURGICAL SUPPLY — 88 items
APPLICATOR COTTON TIP 6 STRL (MISCELLANEOUS) IMPLANT
APPLICATOR COTTON TIP 6IN STRL (MISCELLANEOUS)
APPLICATOR VISTASEAL 35 (MISCELLANEOUS) ×6 IMPLANT
APPLIER CLIP ROT 13.4 12 LRG (CLIP)
BAG COUNTER SPONGE SURGICOUNT (BAG) IMPLANT
BINDER ABDOMINAL 12 ML 46-62 (SOFTGOODS) IMPLANT
BLADE SURG SZ11 CARB STEEL (BLADE) ×3 IMPLANT
BNDG ADH 1X3 SHEER STRL LF (GAUZE/BANDAGES/DRESSINGS) IMPLANT
CABLE HIGH FREQUENCY MONO STRZ (ELECTRODE) IMPLANT
CHLORAPREP W/TINT 26 (MISCELLANEOUS) ×6 IMPLANT
CLIP APPLIE ROT 13.4 12 LRG (CLIP) IMPLANT
CLIP SUT LAPRA TY ABSORB (SUTURE) ×6 IMPLANT
COTTON BALL STERILE (GAUZE/BANDAGES/DRESSINGS) ×3
COTTON BALL STERILE 2 PK (GAUZE/BANDAGES/DRESSINGS) ×2 IMPLANT
COVER SURGICAL LIGHT HANDLE (MISCELLANEOUS) ×3 IMPLANT
CUTTER FLEX LINEAR 45M (STAPLE) IMPLANT
DECANTER SPIKE VIAL GLASS SM (MISCELLANEOUS) ×3 IMPLANT
DERMABOND ADVANCED (GAUZE/BANDAGES/DRESSINGS)
DERMABOND ADVANCED .7 DNX12 (GAUZE/BANDAGES/DRESSINGS) IMPLANT
DEVICE SECURE STRAP 25 ABSORB (INSTRUMENTS) IMPLANT
DEVICE SUT QUICK LOAD TK 5 (STAPLE) IMPLANT
DEVICE SUT TI-KNOT TK 5X26 (MISCELLANEOUS) IMPLANT
DEVICE SUTURE ENDOST 10MM (ENDOMECHANICALS) ×3 IMPLANT
DEVICE TROCAR PUNCTURE CLOSURE (ENDOMECHANICALS) IMPLANT
DRAIN PENROSE 0.25X18 (DRAIN) ×3 IMPLANT
DRAPE INCISE IOBAN 66X45 STRL (DRAPES) IMPLANT
DRSG TEGADERM 2-3/8X2-3/4 SM (GAUZE/BANDAGES/DRESSINGS) ×3 IMPLANT
DRSG TEGADERM 4X4.75 (GAUZE/BANDAGES/DRESSINGS) ×3 IMPLANT
ELECT REM PT RETURN 15FT ADLT (MISCELLANEOUS) ×3 IMPLANT
GAUZE 4X4 16PLY ~~LOC~~+RFID DBL (SPONGE) ×3 IMPLANT
GAUZE SPONGE 2X2 8PLY STRL LF (GAUZE/BANDAGES/DRESSINGS) ×2 IMPLANT
GAUZE SPONGE 4X4 12PLY STRL (GAUZE/BANDAGES/DRESSINGS) IMPLANT
GLOVE SRG 8 PF TXTR STRL LF DI (GLOVE) ×2 IMPLANT
GLOVE SURG POLY ORTHO LF SZ7.5 (GLOVE) ×3 IMPLANT
GLOVE SURG UNDER POLY LF SZ8 (GLOVE) ×1
GOWN STRL REUS W/TWL XL LVL3 (GOWN DISPOSABLE) ×12 IMPLANT
KIT BASIN OR (CUSTOM PROCEDURE TRAY) ×3 IMPLANT
KIT GASTRIC LAVAGE 34FR ADT (SET/KITS/TRAYS/PACK) ×3 IMPLANT
KIT TURNOVER KIT A (KITS) ×3 IMPLANT
MARKER SKIN DUAL TIP RULER LAB (MISCELLANEOUS) ×3 IMPLANT
MAT PREVALON FULL STRYKER (MISCELLANEOUS) ×3 IMPLANT
NEEDLE SPNL 22GX3.5 QUINCKE BK (NEEDLE) ×3 IMPLANT
PACK CARDIOVASCULAR III (CUSTOM PROCEDURE TRAY) ×3 IMPLANT
PENCIL SMOKE EVACUATOR (MISCELLANEOUS) IMPLANT
RELOAD 45 VASCULAR/THIN (ENDOMECHANICALS) IMPLANT
RELOAD ENDO STITCH 2.0 (ENDOMECHANICALS) ×11
RELOAD STAPLE TA45 3.5 REG BLU (ENDOMECHANICALS) IMPLANT
RELOAD STAPLER BLUE 60MM (STAPLE) ×10 IMPLANT
RELOAD STAPLER GOLD 60MM (STAPLE) ×2 IMPLANT
RELOAD STAPLER WHITE 60MM (STAPLE) ×4 IMPLANT
SCISSORS LAP 5X35 DISP (ENDOMECHANICALS) IMPLANT
SCISSORS LAP 5X45 EPIX DISP (ENDOMECHANICALS) ×3 IMPLANT
SET IRRIG TUBING LAPAROSCOPIC (IRRIGATION / IRRIGATOR) ×3 IMPLANT
SET TUBE SMOKE EVAC HIGH FLOW (TUBING) ×3 IMPLANT
SHEARS HARMONIC ACE PLUS 36CM (ENDOMECHANICALS) IMPLANT
SHEARS HARMONIC ACE PLUS 45CM (MISCELLANEOUS) ×3 IMPLANT
SLEEVE XCEL OPT CAN 5 100 (ENDOMECHANICALS) ×3 IMPLANT
SOL ANTI FOG 6CC (MISCELLANEOUS) ×2 IMPLANT
SOLUTION ANTI FOG 6CC (MISCELLANEOUS) ×1
SPONGE GAUZE 2X2 8PLY STRL LF (GAUZE/BANDAGES/DRESSINGS) ×3 IMPLANT
SPONGE GAUZE 2X2 STER 10/PKG (GAUZE/BANDAGES/DRESSINGS) ×1
STAPLER ECHELON BIOABSB 60 FLE (MISCELLANEOUS) IMPLANT
STAPLER ECHELON LONG 60 440 (INSTRUMENTS) ×3 IMPLANT
STAPLER RELOAD BLUE 60MM (STAPLE) ×15
STAPLER RELOAD GOLD 60MM (STAPLE) ×3
STAPLER RELOAD WHITE 60MM (STAPLE) ×6
STRIP CLOSURE SKIN 1/2X4 (GAUZE/BANDAGES/DRESSINGS) ×18 IMPLANT
SURGILUBE 2OZ TUBE FLIPTOP (MISCELLANEOUS) ×3 IMPLANT
SUT MNCRL AB 4-0 PS2 18 (SUTURE) ×3 IMPLANT
SUT NOVA 1 T20/GS 25DT (SUTURE) ×6 IMPLANT
SUT RELOAD ENDO STITCH 2 48X1 (ENDOMECHANICALS) ×14
SUT RELOAD ENDO STITCH 2.0 (ENDOMECHANICALS) ×8
SUT SURGIDAC NAB ES-9 0 48 120 (SUTURE) IMPLANT
SUT VIC AB 2-0 SH 27 (SUTURE) ×1
SUT VIC AB 2-0 SH 27X BRD (SUTURE) ×2 IMPLANT
SUT VIC AB 3-0 SH 18 (SUTURE) ×3 IMPLANT
SUTURE RELOAD END STTCH 2 48X1 (ENDOMECHANICALS) ×14 IMPLANT
SUTURE RELOAD ENDO STITCH 2.0 (ENDOMECHANICALS) ×8 IMPLANT
SYR 20ML LL LF (SYRINGE) ×6 IMPLANT
TOWEL OR 17X26 10 PK STRL BLUE (TOWEL DISPOSABLE) ×3 IMPLANT
TOWEL OR NON WOVEN STRL DISP B (DISPOSABLE) ×3 IMPLANT
TRAY FOLEY MTR SLVR 16FR STAT (SET/KITS/TRAYS/PACK) IMPLANT
TRAY LAPAROSCOPIC (CUSTOM PROCEDURE TRAY) ×3 IMPLANT
TROCAR BLADELESS OPT 5 100 (ENDOMECHANICALS) ×3 IMPLANT
TROCAR UNIVERSAL OPT 12M 100M (ENDOMECHANICALS) ×9 IMPLANT
TROCAR XCEL 12X100 BLDLESS (ENDOMECHANICALS) ×3 IMPLANT
TROCAR XCEL NON-BLD 11X100MML (ENDOMECHANICALS) ×3 IMPLANT
TUBING CONNECTING 10 (TUBING) ×6 IMPLANT

## 2021-04-08 NOTE — Op Note (Signed)
Scott Clark [id number] 21-Jul-1981. 04/08/2021  Preoperative diagnosis:  Severe obesity BMI 70  Severe sleep apnea  Thrombocytopenia (CMS-HCC)  Essential hypertension  Bilateral chronic knee pain  Chronic midline low back pain without sciatica  Incarcerated umbilical hernia  Prediabetes  Chronic kidney disease, unspecified CKD stage  Low HDL (under 40)  Persistent atrial fibrillation (CMS-HCC)   Postoperative  diagnosis:  Severe obesity (CMS-HCC)  Severe sleep apnea  Thrombocytopenia (CMS-HCC)  Essential hypertension  Bilateral chronic knee pain  Chronic midline low back pain without sciatica  umbilical hernia  Prediabetes  Chronic kidney disease, unspecified CKD stage  Low HDL (under 40)  Persistent atrial fibrillation (CMS-HCC)   Surgical procedure: Laparoscopic Roux-en-Y gastric bypass (ante-colic, ante-gastric); upper endoscopy; open primary repair of umbilical hernia  Surgeon: Atilano Ina, M.D. FACS  Asst.: Luretha Murphy MD FACS  Anesthesia: General plus exparel/marcaine mix  Complications: None   EBL: 25 cc   Drains: None   Disposition: PACU in good condition   Indications for procedure: 40 y.o. yo male with morbid obesity who has been unsuccessful at sustained weight loss. The patient's comorbidities are listed above. We discussed the risk and benefits of surgery including but not limited to anesthesia risk, bleeding, infection, blood clot formation, anastomotic leak, anastomotic stricture, ulcer formation, death, respiratory complications, intestinal blockage, internal hernia, gallstone formation, vitamin and nutritional deficiencies, injury to surrounding structures, failure to lose weight and mood changes.   Description of procedure: Patient is brought to the operating room and general anesthesia induced. The patient had received preoperative broad-spectrum IV antibiotics and subcutaneous heparin. The abdomen was widely sterilely  prepped with Chloraprep and draped. Patient timeout was performed and correct patient and procedure confirmed. Access was obtained with a 12 mm Optiview trocar in the left upper quadrant and pneumoperitoneum established without difficulty. Under direct vision 12 mm trocars were placed laterally in the right upper quadrant, right upper quadrant midclavicular line, and to the left and above the umbilicus for the camera port. A 5 mm trocar was placed laterally in the left upper quadrant.  The patient had a known pre-existing umbilical hernia.  His umbilical skin was somewhat ulcerated.  Even though on physical exam I thought he had omentum probably incarcerated in his umbilical hernia.  There is nothing within the fascial defect at the umbilicus.  The defect was probably about 2-1/2 cm x 3 cm.  His liver appeared grossly normal.  It was not severely steatotic.  I placed the patient in reverse Trendelenburg to just make sure that I can technically do the procedure.  I was able to lift up the left lobe of the liver with an atraumatic grasper.  I had good visualization of the foregut.  So at this time I decided to proceed with the procedure as planned.  Exparel/marcaine mix was infiltrated in bilateral lateral abdominal walls as a TAP block for postoperative pain relief.  The patient had a large amount of omentum. The omentum was brought into the upper abdomen and the transverse mesocolon elevated and the ligament of Treitz clearly identified. A 60 cm biliopancreatic limb was then carefully measured from the ligament of Treitz. The small intestine was divided at this point with a single firing of the white load linear stapler. A Penrose drain was sutured to the end of the Roux-en-Y limb for later identification. A 100 cm Roux-en-Y limb was then carefully measured. At this point a side-to-side anastomosis was created between the Roux limb and the end of the  biliopancreatic limb. This was accomplished with a single firing  of the 60 mm white load linear stapler. The common enterotomy was closed with a running 2-0 Vicryl begun at either end of the enterotomy and tied centrally. Vistaseal tissue sealant was placed over the anastomosis. The mesenteric defect was then closed with running 2-0 silk. The omentum was then divided with the harmonic scalpel up towards the transverse colon to allow mobility of the Roux limb toward the gastric pouch. The patient was then placed in steep reversed Trendelenburg. Through a 5 mm subxiphoid site the [facility] retractor was placed and the left lobe of the liver elevated with excellent exposure of the upper stomach and hiatus. The angle of Hiss was then mobilized with the harmonic scalpel. A 6 cm gastric pouch was then carefully measured along the lesser curve of the stomach. Dissection was carried along the lesser curve at this point with the Harmonic scalpel working carefully back toward the lesser sac at right angles to the lesser curve. The free lesser sac was then entered. After being sure all tubes were removed from the stomach an initial firing of the gold load 60 mm linear stapler was fired at right angles across the lesser curve for about 4 cm. The gastric pouch was further mobilized posteriorly and then the pouch was completed with 4 further firings of the 60 mm blue load linear stapler up through the previously dissected angle of His. It was ensured that the pouch was completely mobilized away from the gastric remnant. This created a nice tubular 4-5 cm gastric pouch. The Roux limb was then brought up in an antecolic fashion with the candycane facing to the patient's left without undue tension. The gastrojejunostomy was created with an initial posterior row of 2-0 Vicryl between the Roux limb and the staple line of the gastric pouch. Enterotomies were then made in the gastric pouch and the Roux limb with the harmonic scalpel and at approximately 2-2-1/2 cm anastomosis was created with a  single firing of the 53mm blue load linear stapler. The staple line was inspected and was intact without bleeding. The common enterotomy was then closed with running 2-0 Vicryl begun at either end and tied centrally. The Ewall tube was then easily passed through the anastomosis and an outer anterior layer of running 2-0 Vicryl was placed. The Ewald tube was removed. With the outlet of the gastrojejunostomy clamped and under saline irrigation the assistant performed upper endoscopy and with the gastric pouch tensely distended with air-there was no evidence of leak on this test. The pouch was desufflated. The Vonita Moss defect was closed with running 2-0 silk. The abdomen was inspected for any evidence of bleeding or bowel injury and everything looked fine. The Nathanson retractor was removed under direct vision after coating the anastomosis with Vistaseal tissue sealant.  The patient was placed supine.  At this time we addressed the umbilical hernia.  I was concerned that he would be at risk for incarceration of small bowel after the procedure.  Even though he was at high risk for recurrent hernia I felt that the risk of incarcerated bowel containing hernia was higher.  A curvilinear infraumbilical incision was made with 11 blade.  Subcutaneous tissue was divided we lifted the umbilical stalk up.  We identified the fascial defect.  It was probably about 3 cm x 2 cm.  Coker's were placed on each edge of the fascia.  It was then closed transversely with 6 interrupted #1 Novafil sutures.  I  then returned laparoscopically.  We visualized the umbilical closure.  It was airtight.  There is nothing trapped within it.  Additional Exparel Marcaine mixture was infiltrated in this area.  Pneumoperitoneum was then released.   All CO2 was evacuated and trochars removed.  The base of the umbilical stalk was then tacked down to the umbilical fascia with 3-0 Vicryl suture.  Skin incisions were closed with 4-0 monocryl in a  subcuticular fashion followed by  steri-strips and bandages. Sponge needle and instrument counts were correct. The patient was taken to the PACU in good condition.    Mary Sella. Andrey Campanile, MD, FACS General, Bariatric, & Minimally Invasive Surgery [facility], [place]

## 2021-04-08 NOTE — Discharge Instructions (Signed)

## 2021-04-08 NOTE — Anesthesia Procedure Notes (Signed)
Procedure Name: Intubation Date/Time: 04/08/2021 8:15 AM Performed by: Theodosia Quay, CRNA Pre-anesthesia Checklist: Patient identified, Emergency Drugs available, Suction available, Patient being monitored and Timeout performed Patient Re-evaluated:Patient Re-evaluated prior to induction Oxygen Delivery Method: Circle system utilized Preoxygenation: Pre-oxygenation with 100% oxygen Induction Type: IV induction, Rapid sequence and Cricoid Pressure applied Ventilation: Mask ventilation without difficulty Laryngoscope Size: Glidescope and 4 Grade View: Grade I Tube type: Oral Tube size: 7.5 mm Number of attempts: 1 Airway Equipment and Method: Stylet, Video-laryngoscopy and Patient positioned with wedge pillow Placement Confirmation: ETT inserted through vocal cords under direct vision, breath sounds checked- equal and bilateral and positive ETCO2 Secured at: 24 cm Tube secured with: Tape Dental Injury: Teeth and Oropharynx as per pre-operative assessment  Difficulty Due To: Difficulty was anticipated Comments: ATOI Elective glide.

## 2021-04-08 NOTE — Transfer of Care (Signed)
Immediate Anesthesia Transfer of Care Note  Patient: Scott Clark  Procedure(s) Performed: Procedure(s): LAPAROSCOPIC ROUX-EN-Y GASTRIC BYPASS WITH UPPER ENDOSCOPY (N/A) OPEN UMBILICAL HERNIA (N/A) UPPER GI ENDOSCOPY (N/A)  Patient Location: PACU  Anesthesia Type:General  Level of Consciousness:  sedated, patient cooperative and responds to stimulation  Airway & Oxygen Therapy:Patient Spontanous Breathing and Patient connected to face mask oxgen  Post-op Assessment:  Report given to PACU RN and Post -op Vital signs reviewed and stable  Post vital signs:  Reviewed and stable  Last Vitals:  Vitals:   04/08/21 0614  BP: (!) 148/83  Pulse: 80  Resp: (!) 22  Temp: 36.6 C  SpO2: 96%    Complications: No apparent anesthesia complications

## 2021-04-08 NOTE — Progress Notes (Signed)

## 2021-04-08 NOTE — Interval H&P Note (Signed)
History and Physical Interval Note:  04/08/2021 7:08 AM  Scott Clark  has presented today for surgery, with the diagnosis of MORBID OBESITY.  The various methods of treatment have been discussed with the patient and family. After consideration of risks, benefits and other options for treatment, the patient has consented to  Procedure(s): LAPAROSCOPIC ROUX-EN-Y GASTRIC BYPASS WITH UPPER ENDOSCOPY (N/A) LAPAROSCOPIC UMBILICAL HERNIA (N/A) UPPER GI ENDOSCOPY (N/A) as a surgical intervention.  The patient's history has been reviewed, patient examined, no change in status, stable for surgery.  I have reviewed the patient's chart and labs.  Questions were answered to the patient's satisfaction.     Gaynelle Adu

## 2021-04-08 NOTE — Progress Notes (Signed)
PHARMACY CONSULT FOR:  Risk Assessment for Post-Discharge VTE Following Bariatric Surgery  Post-Discharge VTE Risk Assessment: This patient's probability of 30-day post-discharge VTE is increased due to the factors marked:  X Male    Age >/=60 years  X  BMI >/=50 kg/m2    CHF    Dyspnea at Rest    Paraplegia  X  Non-gastric-band surgery    Operation Time >/=3 hr    Return to OR     Length of Stay >/= 3 d   Hx of VTE   Hypercoagulable condition   Significant venous stasis    Predicted probability of 30-day post-discharge VTE: 0.51% - moderate risk  Other patient-specific factors to consider:   Recommendation for Discharge: Enoxaparin 60 mg [place] q12h x 2 weeks post-discharge  Scott Clark is a 40 y.o. male who underwent  laparoscopic roux-en-y gastric bypass with upper endoscopy and open umbilical hernia on 04/08/21   Case start: 0812 Case end: 1057   No Known Allergies  Patient Measurements: Height: 6\' 2"  (188 cm) Weight: (!) 249.5 kg (550 lb) IBW/kg (Calculated) : 82.2 Body mass index is 70.62 kg/m.  Recent Labs    04/08/21 0546 04/08/21 1322  HGB  --  13.7  HCT  --  43.9  APTT 28  --    Estimated Creatinine Clearance: 155.3 mL/min (A) (by C-G formula based on SCr of 1.36 mg/dL (H)).    Past Medical History:  Diagnosis Date   Abdominal obesity-metabolic syndrome type 3    Atrial fibrillation (HCC)    Bilateral edema of lower extremity    Normal ABI's (09/2015)   Hypertension    Sleep apnea      Medications Prior to Admission  Medication Sig Dispense Refill Last Dose   buPROPion (WELLBUTRIN XL) 300 MG 24 hr tablet Take 300 mg by mouth daily.   04/08/2021 at 0400   celecoxib (CELEBREX) 200 MG capsule Take 200 mg by mouth daily as needed for moderate pain.   04/07/2021   diltiazem (CARDIZEM CD) 240 MG 24 hr capsule Take 1 capsule (240 mg total) by mouth daily. 30 capsule 1    metoprolol succinate (TOPROL-XL) 200 MG 24 hr tablet Take 1 tablet (200 mg  total) by mouth daily. Take with or immediately following a meal. 90 tablet 3 04/08/2021 at 0400   traMADol-acetaminophen (ULTRACET) 37.5-325 MG tablet Take 1 tablet by mouth every 8 (eight) hours as needed for moderate pain.   04/05/2021   aspirin EC 81 MG EC tablet Take 1 tablet (81 mg total) by mouth daily. (Patient not taking: Reported on 03/29/2021)   Not Taking    05/29/2021, PharmD 04/08/2021 2:17 PM

## 2021-04-08 NOTE — Anesthesia Postprocedure Evaluation (Signed)
Anesthesia Post Note  Patient: Scott Clark  Procedure(s) Performed: LAPAROSCOPIC ROUX-EN-Y GASTRIC BYPASS WITH UPPER ENDOSCOPY (Abdomen) OPEN UMBILICAL HERNIA (Abdomen) UPPER GI ENDOSCOPY     Patient location during evaluation: PACU Anesthesia Type: General Level of consciousness: awake and alert Pain management: pain level controlled Vital Signs Assessment: post-procedure vital signs reviewed and stable Respiratory status: spontaneous breathing, nonlabored ventilation, respiratory function stable and patient connected to nasal cannula oxygen Cardiovascular status: blood pressure returned to baseline and stable Postop Assessment: no apparent nausea or vomiting Anesthetic complications: no   No notable events documented.  Last Vitals:  Vitals:   04/08/21 1403 04/08/21 1409  BP: (!) 184/96 (!) 156/85  Pulse: 95   Resp: (!) 23   Temp:    SpO2: 93%     Last Pain:  Vitals:   04/08/21 1400  TempSrc:   PainSc: Asleep                 Trevor Iha

## 2021-04-09 ENCOUNTER — Encounter (HOSPITAL_COMMUNITY): Payer: Self-pay | Admitting: General Surgery

## 2021-04-09 LAB — COMPREHENSIVE METABOLIC PANEL
ALT: 26 U/L (ref 0–44)
AST: 22 U/L (ref 15–41)
Albumin: 3.4 g/dL — ABNORMAL LOW (ref 3.5–5.0)
Alkaline Phosphatase: 50 U/L (ref 38–126)
Anion gap: 6 (ref 5–15)
BUN: 19 mg/dL (ref 6–20)
CO2: 26 mmol/L (ref 22–32)
Calcium: 8.6 mg/dL — ABNORMAL LOW (ref 8.9–10.3)
Chloride: 108 mmol/L (ref 98–111)
Creatinine, Ser: 1.5 mg/dL — ABNORMAL HIGH (ref 0.61–1.24)
GFR, Estimated: >60 mL/min (ref >60)
Glucose, Bld: 103 mg/dL — ABNORMAL HIGH (ref 70–99)
Potassium: 4.2 mmol/L (ref 3.5–5.1)
Sodium: 140 mmol/L (ref 135–145)
Total Bilirubin: 0.7 mg/dL (ref 0.3–1.2)
Total Protein: 7 g/dL (ref 6.5–8.1)

## 2021-04-09 LAB — GLUCOSE, CAPILLARY
Glucose-Capillary: 102 mg/dL — ABNORMAL HIGH (ref 70–99)
Glucose-Capillary: 108 mg/dL — ABNORMAL HIGH (ref 70–99)
Glucose-Capillary: 79 mg/dL (ref 70–99)
Glucose-Capillary: 82 mg/dL (ref 70–99)
Glucose-Capillary: 85 mg/dL (ref 70–99)
Glucose-Capillary: 85 mg/dL (ref 70–99)
Glucose-Capillary: 99 mg/dL (ref 70–99)

## 2021-04-09 LAB — CBC WITH DIFFERENTIAL/PLATELET
Abs Immature Granulocytes: 0.01 10*3/uL (ref 0.00–0.07)
Basophils Absolute: 0 10*3/uL (ref 0.0–0.1)
Basophils Relative: 0 %
Eosinophils Absolute: 0 10*3/uL (ref 0.0–0.5)
Eosinophils Relative: 0 %
HCT: 36.4 % — ABNORMAL LOW (ref 39.0–52.0)
Hemoglobin: 11.6 g/dL — ABNORMAL LOW (ref 13.0–17.0)
Immature Granulocytes: 0 %
Lymphocytes Relative: 15 %
Lymphs Abs: 1.1 10*3/uL (ref 0.7–4.0)
MCH: 27.7 pg (ref 26.0–34.0)
MCHC: 31.9 g/dL (ref 30.0–36.0)
MCV: 86.9 fL (ref 80.0–100.0)
Monocytes Absolute: 0.5 10*3/uL (ref 0.1–1.0)
Monocytes Relative: 6 %
Neutro Abs: 5.7 10*3/uL (ref 1.7–7.7)
Neutrophils Relative %: 79 %
Platelets: 109 10*3/uL — ABNORMAL LOW (ref 150–400)
RBC: 4.19 MIL/uL — ABNORMAL LOW (ref 4.22–5.81)
RDW: 17.3 % — ABNORMAL HIGH (ref 11.5–15.5)
WBC: 7.2 10*3/uL (ref 4.0–10.5)
nRBC: 0 % (ref 0.0–0.2)

## 2021-04-09 MED ORDER — DILTIAZEM HCL 60 MG PO TABS
60.0000 mg | ORAL_TABLET | Freq: Four times a day (QID) | ORAL | Status: DC
  Administered 2021-04-09 – 2021-04-10 (×5): 60 mg via ORAL
  Filled 2021-04-09 (×6): qty 1

## 2021-04-09 MED ORDER — BUPROPION HCL 100 MG PO TABS
100.0000 mg | ORAL_TABLET | Freq: Three times a day (TID) | ORAL | Status: DC
  Administered 2021-04-09 – 2021-04-10 (×3): 100 mg via ORAL
  Filled 2021-04-09 (×4): qty 1

## 2021-04-09 MED ORDER — METOPROLOL TARTRATE 50 MG PO TABS
100.0000 mg | ORAL_TABLET | Freq: Two times a day (BID) | ORAL | Status: DC
  Administered 2021-04-09 – 2021-04-10 (×3): 100 mg via ORAL
  Filled 2021-04-09 (×3): qty 2

## 2021-04-09 MED ORDER — ENOXAPARIN (LOVENOX) PATIENT EDUCATION KIT
PACK | Freq: Once | Status: AC
  Filled 2021-04-09: qty 1

## 2021-04-09 NOTE — Progress Notes (Signed)
1 Day Post-Op   Subjective/Chief Complaint: "Doing great" Family member and bariatric nurse at [facility] Pain No nv Still on water   Objective: Vital signs in last 24 hours: Temp:  [97.2 F (36.2 C)-99.6 F (37.6 C)] 98 F (36.7 C) (09/20 0937) Pulse Rate:  [64-105] 82 (09/20 0937) Resp:  [13-28] 18 (09/20 0533) BP: (136-192)/(71-138) 156/100 (09/20 0937) SpO2:  [90 %-100 %] 100 % (09/20 0937) Last BM Date: 04/08/21  Intake/Output from previous day: 09/19 0701 - 09/20 0700 In: 3978.6 [P.O.:180; I.V.:3798.6] Out: 2975 [Urine:2925; Blood:50] Intake/Output this shift: Total I/O In: 120 [P.O.:120] Out: 0   Alert, nontoxic Sitting in bs chair Symm chest rise Not tachy Obese, soft, some old drainage on umbilical gauze, o/w dressings intact Chronic venous insuff to b/l LE  Lab Results:  Recent Labs    04/08/21 1322 04/09/21 0415  WBC  --  7.2  HGB 13.7 11.6*  HCT 43.9 36.4*  PLT  --  109*   BMET Recent Labs    04/09/21 0415  NA 140  K 4.2  CL 108  CO2 26  GLUCOSE 103*  BUN 19  CREATININE 1.50*  CALCIUM 8.6*   PT/INR No results for input(s): LABPROT, INR in the last 72 hours. ABG No results for input(s): PHART, HCO3 in the last 72 hours.  Invalid input(s): PCO2, PO2  Studies/Results: No results found.  Anti-infectives: Anti-infectives (From admission, onward)    Start     Dose/Rate Route Frequency Ordered Stop   04/08/21 0600  cefoTEtan (CEFOTAN) 2 g in sodium chloride 0.9 % 100 mL IVPB        2 g 200 mL/hr over 30 Minutes Intravenous On call to O.R. 04/08/21 0513 04/08/21 0746       Assessment/Plan: s/p Procedure(s): LAPAROSCOPIC ROUX-EN-Y GASTRIC BYPASS WITH UPPER ENDOSCOPY (N/A) OPEN UMBILICAL HERNIA (N/A) UPPER GI ENDOSCOPY (N/A)  Severe obesity (CMS-HCC)  Severe sleep apnea  Thrombocytopenia (CMS-HCC)  Essential hypertension  Bilateral chronic knee pain  Chronic midline low back pain without sciatica  umbilical  hernia  Prediabetes  Chronic kidney disease, unspecified CKD stage  Low HDL (under 40)  Persistent atrial fibrillation (CMS-HCC)   Doing well Still rate controlled Will need to convert home BP meds to immediate release given new bypass anatomy  Will also need to convert effexor as well Cont chemical vte prophylaxis - hgb ok Will need extended vte prophylaxis on discharge Hasn't started protein shakes yet.  Will keep today bc want to make sure BP controlled on alternative dosages of home meds and hasn't started protein yet Discussed intraop findings with pt   LOS: 1 day    Scott Clark 04/09/2021

## 2021-04-09 NOTE — Progress Notes (Signed)
Nutrition Education Note ° °Received consult for diet education for patient s/p bariatric surgery. ° °Discussed 2 week post op diet with pt. Emphasized that liquids must be non carbonated, non caffeinated, and sugar free. Fluid goals discussed. Pt to follow up with outpatient bariatric RD for further diet progression after 2 weeks. Multivitamins and minerals also reviewed. Teach back method used, pt expressed understanding, expect good compliance. ° °If nutrition issues arise, please consult RD. ° °Itha Kroeker, MS, RD, LDN °Inpatient Clinical Dietitian °Contact information available via Amion ° ° °

## 2021-04-09 NOTE — Progress Notes (Addendum)
Patient alert and oriented, pain is controlled. Patient is tolerating fluids, advanced to protein shake today, patient is tolerating well. Reviewed Gastric Bypass discharge instructions with patient and patient is able to articulate understanding. Provided information on BELT program, Support Group and [facility] outpatient pharmacy. Patient watched Lovenox self injection video and reviewed Patient Lovenox Education Kit. All questions answered, will continue to monitor.

## 2021-04-09 NOTE — Progress Notes (Signed)
Patient started protein.  

## 2021-04-09 NOTE — Progress Notes (Signed)
Put in pharmacy consult Will need to convert all of his XL/ER/sustained release meds to immediate release given new bypass  Mary Sella. Andrey Campanile, MD, FACS General, Bariatric, & Minimally Invasive Surgery [facility], [place]

## 2021-04-10 ENCOUNTER — Other Ambulatory Visit (HOSPITAL_COMMUNITY): Payer: Self-pay

## 2021-04-10 LAB — CBC WITH DIFFERENTIAL/PLATELET
Abs Immature Granulocytes: 0.02 10*3/uL (ref 0.00–0.07)
Basophils Absolute: 0 10*3/uL (ref 0.0–0.1)
Basophils Relative: 0 %
Eosinophils Absolute: 0.1 10*3/uL (ref 0.0–0.5)
Eosinophils Relative: 2 %
HCT: 34.7 % — ABNORMAL LOW (ref 39.0–52.0)
Hemoglobin: 11.1 g/dL — ABNORMAL LOW (ref 13.0–17.0)
Immature Granulocytes: 0 %
Lymphocytes Relative: 32 %
Lymphs Abs: 2 10*3/uL (ref 0.7–4.0)
MCH: 28 pg (ref 26.0–34.0)
MCHC: 32 g/dL (ref 30.0–36.0)
MCV: 87.4 fL (ref 80.0–100.0)
Monocytes Absolute: 0.5 10*3/uL (ref 0.1–1.0)
Monocytes Relative: 8 %
Neutro Abs: 3.7 10*3/uL (ref 1.7–7.7)
Neutrophils Relative %: 58 %
Platelets: 94 10*3/uL — ABNORMAL LOW (ref 150–400)
RBC: 3.97 MIL/uL — ABNORMAL LOW (ref 4.22–5.81)
RDW: 17.5 % — ABNORMAL HIGH (ref 11.5–15.5)
WBC: 6.3 10*3/uL (ref 4.0–10.5)
nRBC: 0 % (ref 0.0–0.2)

## 2021-04-10 LAB — GLUCOSE, CAPILLARY
Glucose-Capillary: 78 mg/dL (ref 70–99)
Glucose-Capillary: 81 mg/dL (ref 70–99)
Glucose-Capillary: 73 mg/dL (ref 70–99)

## 2021-04-10 MED ORDER — ONDANSETRON 4 MG PO TBDP
4.0000 mg | ORAL_TABLET | Freq: Four times a day (QID) | ORAL | 0 refills | Status: DC | PRN
  Filled 2021-04-10: qty 20, 5d supply, fill #0

## 2021-04-10 MED ORDER — METOPROLOL TARTRATE 100 MG PO TABS
100.0000 mg | ORAL_TABLET | Freq: Two times a day (BID) | ORAL | 1 refills | Status: DC
  Filled 2021-04-10: qty 60, 30d supply, fill #0

## 2021-04-10 MED ORDER — ENOXAPARIN SODIUM 60 MG/0.6ML IJ SOSY
60.0000 mg | PREFILLED_SYRINGE | Freq: Two times a day (BID) | INTRAMUSCULAR | 0 refills | Status: DC
  Filled 2021-04-10: qty 16.8, 14d supply, fill #0

## 2021-04-10 MED ORDER — ACETAMINOPHEN 500 MG PO TABS: 1000.0000 mg | ORAL_TABLET | Freq: Three times a day (TID) | ORAL | 0 refills | Status: AC

## 2021-04-10 MED ORDER — TRAMADOL HCL 50 MG PO TABS
50.0000 mg | ORAL_TABLET | Freq: Four times a day (QID) | ORAL | 0 refills | Status: DC | PRN
  Filled 2021-04-10: qty 10, 3d supply, fill #0

## 2021-04-10 MED ORDER — PANTOPRAZOLE SODIUM 40 MG PO TBEC
40.0000 mg | DELAYED_RELEASE_TABLET | Freq: Every day | ORAL | 0 refills | Status: DC
  Filled 2021-04-10: qty 30, 30d supply, fill #0

## 2021-04-10 MED ORDER — DILTIAZEM HCL 60 MG PO TABS
60.0000 mg | ORAL_TABLET | Freq: Four times a day (QID) | ORAL | 1 refills | Status: DC
  Filled 2021-04-10: qty 120, 30d supply, fill #0

## 2021-04-10 MED ORDER — BUPROPION HCL 100 MG PO TABS
100.0000 mg | ORAL_TABLET | Freq: Three times a day (TID) | ORAL | 2 refills | Status: DC
  Filled 2021-04-10: qty 90, 30d supply, fill #0

## 2021-04-10 NOTE — Progress Notes (Signed)
Pt given discharge packet and sinstructions. All questions answered. No questions at this time. Pt left the floor ambulatory with staff member. IV removed.

## 2021-04-10 NOTE — Discharge Summary (Signed)
Physician Discharge Summary  Scott Clark YYT:[id number] DOB: 05-Jan-1981 DOA: 04/08/2021  PCP: Assunta Found, MD  Admit date: 04/08/2021 Discharge date: 04/10/2021  Recommendations for Outpatient Follow-up:     Follow-up Information     Gaynelle Adu, MD. Go on 05/01/2021.   Specialty: General Surgery Why: at 9:30am.  Please arrive 15 minutes prior to your appointment time.  Thank you. Contact information: [address] [place] [address] [phone]         Hedda Slade, PA-C. Go on 06/04/2021.   Specialty: General Surgery Why: at 2:30pm for Dr. Andrey Campanile.  Please arrive 15 minutes prior to your appointment time. Thank you. Contact information: [address] [place] [address] [phone]                Discharge Diagnoses:  Active Problems:   S/P gastric bypass Severe obesity (CMS-HCC)  Severe sleep apnea  Thrombocytopenia (CMS-HCC)  Essential hypertension  Bilateral chronic knee pain  Chronic midline low back pain without sciatica  umbilical hernia  Prediabetes  Chronic kidney disease, unspecified CKD stage  Low HDL (under 40)  Persistent atrial fibrillation (CMS-HCC)   Surgical Procedure: Laparoscopic Roux-en-Y gastric bypass, upper endoscopy; open primary umbilical hernia repair  Discharge Condition: Good Disposition: Home  Diet recommendation: Postoperative gastric bypass diet  Filed Weights   04/08/21 0535  Weight: (!) 249.5 kg     Hospital Course:  The patient was admitted for a planned laparoscopic Roux-en-Y gastric bypass. Please see operative note. We did have to repair a umbilical hernia primarily.Preoperatively the patient was given 5000 units of subcutaneous heparin for DVT prophylaxis. ERAS protocol was used. Postoperative prophylactic Lovenox dosing was started on the evening of postoperative day 0.  The patient was started on ice chips and water on the evening of POD 0 which they tolerated. On  postoperative day 1 The patient's diet was advanced to protein shakes which they also tolerated. On POD 2, The patient was ambulating without difficulty. Their vital signs are stable without fever or tachycardia. Their hemoglobin had remained stable. The patient was maintained on their home settings for CPAP therapy. The patient had received discharge instructions and counseling. They were deemed stable for discharge.  Because of his high BMI he was at high risk for post discharge VTE so he was sent out on twice daily Lovenox prophylactic dosing.  Because of his new gastric bypass anatomy his home medications for his blood pressure and A. fib and depression had to be changed to immediate release  BP (!) 138/96 (BP Location: Right Arm)   Pulse 88   Temp 98 F (36.7 C) (Oral)   Resp 18   Ht 6\' 2"  (1.88 m)   Wt (!) 249.5 kg   SpO2 100%   BMI 70.62 kg/m   Gen: alert, NAD, non-toxic appearing Pupils: equal, no scleral icterus Pulm: Lungs clear to auscultation, symmetric chest rise CV: regular rate and rhythmSome drainage from umbilical hernia repair which is expected Abd: soft, min tender, nondistended. No cellulitis. No incisional hernia;  Ext: no edema, no calf tenderness Skin: no rash, no jaundice  Discharge Instructions  Discharge Instructions     Ambulate hourly while awake   Complete by: As directed    Call MD for:  difficulty breathing, headache or visual disturbances   Complete by: As directed    Call MD for:  persistant dizziness or light-headedness   Complete by: As directed    Call MD for:  persistant nausea and vomiting   Complete by: As directed    Call MD for:  redness, tenderness, or signs of infection (pain, swelling, redness, odor or green/yellow discharge around incision site)   Complete by: As directed    Call MD for:  severe uncontrolled pain   Complete by: As directed    Call MD for:  temperature >101 F   Complete by: As directed    Diet bariatric full  liquid   Complete by: As directed    Discharge instructions   Complete by: As directed    See bariatric discharge instructions   Incentive spirometry   Complete by: As directed    Perform hourly while awake      Allergies as of 04/10/2021   No Known Allergies      Medication List     STOP taking these medications    aspirin 81 MG EC tablet   buPROPion 300 MG 24 hr tablet Commonly known as: WELLBUTRIN XL Replaced by: buPROPion 100 MG tablet   celecoxib 200 MG capsule Commonly known as: CELEBREX   diltiazem 240 MG 24 hr capsule Commonly known as: CARDIZEM CD   metoprolol 200 MG 24 hr tablet Commonly known as: TOPROL-XL   traMADol-acetaminophen 37.5-325 MG tablet Commonly known as: ULTRACET       TAKE these medications    acetaminophen 500 MG tablet Commonly known as: TYLENOL Take 2 tablets (1,000 mg total) by mouth every 8 (eight) hours for 5 days.   buPROPion 100 MG tablet Commonly known as: WELLBUTRIN Take 1 tablet (100 mg total) by mouth 3 (three) times daily. Replaces: buPROPion 300 MG 24 hr tablet   diltiazem 60 MG tablet Commonly known as: CARDIZEM Take 1 tablet (60 mg total) by mouth every 6 (six) hours.   enoxaparin 60 MG/0.6ML injection Commonly known as: LOVENOX Inject 0.6 mLs (60 mg total) into the skin 2 (two) times daily for 14 days.   metoprolol tartrate 100 MG tablet Commonly known as: LOPRESSOR Take 1 tablet (100 mg total) by mouth 2 (two) times daily.   ondansetron 4 MG disintegrating tablet Commonly known as: ZOFRAN-ODT Take 1 tablet (4 mg total) by mouth every 6 (six) hours as needed for nausea or vomiting.   pantoprazole 40 MG tablet Commonly known as: PROTONIX Take 1 tablet (40 mg total) by mouth daily.   traMADol 50 MG tablet Commonly known as: ULTRAM Take 1 tablet (50 mg total) by mouth every 6 (six) hours as needed (pain).        Follow-up Information     Gaynelle Adu, MD. Go on 05/01/2021.   Specialty: General  Surgery Why: at 9:30am.  Please arrive 15 minutes prior to your appointment time.  Thank you. Contact information: [address] [place] [address] [phone]         Hedda Slade, PA-C. Go on 06/04/2021.   Specialty: General Surgery Why: at 2:30pm for Dr. Andrey Campanile.  Please arrive 15 minutes prior to your appointment time. Thank you. Contact information: [address] [place] [address] [phone]                  The results of significant diagnostics from this hospitalization (including imaging, microbiology, ancillary and laboratory) are listed below for reference.    Significant Diagnostic Studies: No results found.  Labs: Basic Metabolic Panel: Recent Labs  Lab 04/03/21 1433 04/09/21 0415  NA 138 140  K 4.0 4.2  CL 105  108  CO2 25 26  GLUCOSE 78 103*  BUN 19 19  CREATININE 1.36* 1.50*  CALCIUM 8.8* 8.6*   Liver Function Tests: Recent Labs  Lab 04/03/21 1433 04/09/21 0415  AST 26 22  ALT 30 26  ALKPHOS 66 50  BILITOT 0.7 0.7  PROT 7.5 7.0  ALBUMIN 3.8 3.4*    CBC: Recent Labs  Lab 04/03/21 1433 04/08/21 1322 04/09/21 0415 04/10/21 0354  WBC 4.0  --  7.2 6.3  NEUTROABS 1.5*  --  5.7 3.7  HGB 12.5* 13.7 11.6* 11.1*  HCT 39.7 43.9 36.4* 34.7*  MCV 87.6  --  86.9 87.4  PLT 120*  --  109* 94*    CBG: Recent Labs  Lab 04/09/21 1954 04/09/21 2356 04/10/21 0351 04/10/21 0741 04/10/21 1151  GLUCAP 82 108* 78 81 73    Active Problems:   S/P gastric bypass   Time coordinating discharge: 30 min  Signed:  Atilano Ina, MD [facility], [place] [phone] 04/10/2021, 1:13 PM

## 2021-04-10 NOTE — Progress Notes (Signed)
Patient alert and oriented, Post op day 2.  Provided support and encouragement.  Encouraged pulmonary toilet, ambulation and small sips of liquids.  All questions answered.  Will continue to monitor. 

## 2021-04-11 NOTE — Progress Notes (Signed)
24hr fluid recall prior to discharge: 420mL.  Per dehydration protocol, will call pt to f/u within one week post op. ?

## 2021-04-12 ENCOUNTER — Telehealth (HOSPITAL_COMMUNITY): Payer: Self-pay | Admitting: *Deleted

## 2021-04-12 NOTE — Telephone Encounter (Addendum)
1.  Tell me about your pain and pain management? Pt denies any pain.  2.  Let's talk about fluid intake.  How much total fluid are you taking in? Pt states that he is working to meet goal of 64 oz of fluid today.  Pt stated that he was able to consume 50oz of fluid yesterday. Pt encouraged to continue to work towards meeting goal.  Pt instructed to assess status and suggestions daily utilizing Hydration Action Plan on discharge folder and to call [facility] if in the "red zone".   3.  How much protein have you taken in the last 2 days? Pt states he is meeting his goal of 80g of protein each day with the protein shakes and protein water.  4.  Have you had nausea?  Tell me about when have experienced nausea and what you did to help? Pt denies nausea.   5.  Has the frequency or color changed with your urine? Pt states that he is urinating "fine" with no changes in frequency or urgency.     6.  Tell me what your incisions look like? "Incisions look fine". Pt denies a fever, chills.  Pt states incisions are not swollen, open, or draining.  Pt encouraged to call [facility] if incisions change.   7.  Have you been passing gas? BM? Pt states that he is having BMs. Last BM 04/11/21.     8.  If a problem or question were to arise who would you call?  Do you know contact numbers for [facility], [facility], and [facility]? Pt denies dehydration symptoms.  Pt can describe s/sx of dehydration.  Pt knows to call [facility] for surgical, [facility] for nutrition, and [facility] for non-urgent questions or concerns.   9.  How has the walking going? Pt states he is walking around and able to be active without difficulty.   10. Are you still using your incentive spirometer?  If so, how often?  Pt states that he is not doing I.S. Pt encouraged to use incentive spirometer, at least 10x every hour while awake until he sees the surgeon.  11.  How are your vitamins and calcium going?  How are you taking them? Pt states that he is taking his supplements and  vitamins without difficulty.  Pt states that he is doing Lovenox injections twice a day without any difficulty or concerns.  Reminded patient that the first 30 days post-operatively are important for successful recovery.  Practice good hand hygiene, wearing a mask when appropriate (since optional in most places), and minimizing exposure to people who live outside of the home, especially if they are exhibiting any respiratory, GI, or illness-like symptoms.

## 2021-04-13 NOTE — Progress Notes (Signed)
Unable to ambulate pt bc he has been snoring loudly in the recliner since he has gotten to the floor. Reported to 7p nurse that pt needs to ambulate as soon as safe to do so. Late entry.

## 2021-04-23 ENCOUNTER — Other Ambulatory Visit: Payer: Self-pay

## 2021-04-23 ENCOUNTER — Encounter: Payer: PRIVATE HEALTH INSURANCE | Attending: General Surgery | Admitting: Skilled Nursing Facility1

## 2021-04-24 NOTE — Progress Notes (Signed)
2 Week Post-Operative Nutrition Class   Patient was seen on 04/23/2021 for Post-Operative Nutrition education at the [facility].    Surgery date: 04/08/2021 Surgery type: RYGB Start weight at [facility]: 547.9 pounds Weight today: 516.3 pounds Bowel Habits: Every day to every other day no complaints     The following the learning objectives were met by the patient during this course: Identifies Phase 3 (Soft, High Proteins) Dietary Goals and will begin from 2 weeks post-operatively to 2 months post-operatively Identifies appropriate sources of fluids and proteins  Identifies appropriate fat sources and healthy verses unhealthy fat types   States protein recommendations and appropriate sources post-operatively Identifies the need for appropriate texture modifications, mastication, and bite sizes when consuming solids Identifies appropriate fat consumption and sources Identifies appropriate multivitamin and calcium sources post-operatively Describes the need for physical activity post-operatively and will follow MD recommendations States when to call healthcare provider regarding medication questions or post-operative complications   Handouts given during class include: Phase 3A: Soft, High Protein Diet Handout Phase 3 High Protein Meals Healthy Fats   Follow-Up Plan: Patient will follow-up at [facility] in 6 weeks for 2 month post-op nutrition visit for diet advancement per MD.

## 2021-04-27 ENCOUNTER — Other Ambulatory Visit: Payer: Self-pay

## 2021-04-27 ENCOUNTER — Encounter (HOSPITAL_COMMUNITY): Payer: Self-pay

## 2021-04-27 ENCOUNTER — Emergency Department (HOSPITAL_COMMUNITY): Payer: PRIVATE HEALTH INSURANCE

## 2021-04-27 ENCOUNTER — Emergency Department (HOSPITAL_COMMUNITY)
Admission: EM | Admit: 2021-04-27 | Discharge: 2021-04-28 | Disposition: A | Discharge status: Home / Self Care | Payer: PRIVATE HEALTH INSURANCE | Attending: Emergency Medicine | Admitting: Emergency Medicine

## 2021-04-27 DIAGNOSIS — R609 Edema, unspecified: Secondary | ICD-10-CM | POA: Insufficient documentation

## 2021-04-27 DIAGNOSIS — Z9884 Bariatric surgery status: Secondary | ICD-10-CM | POA: Diagnosis not present

## 2021-04-27 DIAGNOSIS — L03311 Cellulitis of abdominal wall: Secondary | ICD-10-CM | POA: Insufficient documentation

## 2021-04-27 DIAGNOSIS — R109 Unspecified abdominal pain: Secondary | ICD-10-CM | POA: Diagnosis present

## 2021-04-27 DIAGNOSIS — Z87891 Personal history of nicotine dependence: Secondary | ICD-10-CM | POA: Insufficient documentation

## 2021-04-27 DIAGNOSIS — R590 Localized enlarged lymph nodes: Secondary | ICD-10-CM | POA: Insufficient documentation

## 2021-04-27 DIAGNOSIS — L7622 Postprocedural hemorrhage and hematoma of skin and subcutaneous tissue following other procedure: Secondary | ICD-10-CM | POA: Insufficient documentation

## 2021-04-27 DIAGNOSIS — Z79899 Other long term (current) drug therapy: Secondary | ICD-10-CM | POA: Insufficient documentation

## 2021-04-27 DIAGNOSIS — Z7901 Long term (current) use of anticoagulants: Secondary | ICD-10-CM | POA: Diagnosis not present

## 2021-04-27 DIAGNOSIS — I10 Essential (primary) hypertension: Secondary | ICD-10-CM | POA: Diagnosis not present

## 2021-04-27 DIAGNOSIS — L7632 Postprocedural hematoma of skin and subcutaneous tissue following other procedure: Secondary | ICD-10-CM | POA: Insufficient documentation

## 2021-04-27 LAB — BASIC METABOLIC PANEL
Anion gap: 9 (ref 5–15)
BUN: 18 mg/dL (ref 6–20)
CO2: 25 mmol/L (ref 22–32)
Calcium: 8.6 mg/dL — ABNORMAL LOW (ref 8.9–10.3)
Chloride: 105 mmol/L (ref 98–111)
Creatinine, Ser: 1.36 mg/dL — ABNORMAL HIGH (ref 0.61–1.24)
GFR, Estimated: >60 mL/min (ref >60)
Glucose, Bld: 96 mg/dL (ref 70–99)
Potassium: 4 mmol/L (ref 3.5–5.1)
Sodium: 139 mmol/L (ref 135–145)

## 2021-04-27 LAB — CBC WITH DIFFERENTIAL/PLATELET
Abs Immature Granulocytes: 0.04 10*3/uL (ref 0.00–0.07)
Basophils Absolute: 0 10*3/uL (ref 0.0–0.1)
Basophils Relative: 0 %
Eosinophils Absolute: 0.1 10*3/uL (ref 0.0–0.5)
Eosinophils Relative: 2 %
HCT: 38.8 % — ABNORMAL LOW (ref 39.0–52.0)
Hemoglobin: 12.1 g/dL — ABNORMAL LOW (ref 13.0–17.0)
Immature Granulocytes: 1 %
Lymphocytes Relative: 17 %
Lymphs Abs: 1.5 10*3/uL (ref 0.7–4.0)
MCH: 27.4 pg (ref 26.0–34.0)
MCHC: 31.2 g/dL (ref 30.0–36.0)
MCV: 87.8 fL (ref 80.0–100.0)
Monocytes Absolute: 0.7 10*3/uL (ref 0.1–1.0)
Monocytes Relative: 8 %
Neutro Abs: 6.4 10*3/uL (ref 1.7–7.7)
Neutrophils Relative %: 72 %
Platelets: 196 10*3/uL (ref 150–400)
RBC: 4.42 MIL/uL (ref 4.22–5.81)
RDW: 16 % — ABNORMAL HIGH (ref 11.5–15.5)
WBC: 8.8 10*3/uL (ref 4.0–10.5)
nRBC: 0 % (ref 0.0–0.2)

## 2021-04-27 LAB — LACTIC ACID, PLASMA: Lactic Acid, Venous: 0.9 mmol/L (ref 0.5–1.9)

## 2021-04-27 MED ORDER — IOHEXOL 350 MG/ML SOLN
80.0000 mL | Freq: Once | INTRAVENOUS | Status: AC | PRN
  Administered 2021-04-27: 80 mL via INTRAVENOUS

## 2021-04-27 MED ORDER — VANCOMYCIN HCL 2000 MG/400ML IV SOLN
2000.0000 mg | Freq: Once | INTRAVENOUS | Status: AC
  Administered 2021-04-27: 2000 mg via INTRAVENOUS
  Filled 2021-04-27: qty 400

## 2021-04-27 NOTE — ED Triage Notes (Signed)
Patient reports that he had Bariatric surgery on 04/08/21. Patient states he noticed increased pain and redness to the incision area x 4 days ago. Patient denies any n/v or fever.

## 2021-04-27 NOTE — ED Notes (Signed)
Pt given update. 

## 2021-04-27 NOTE — ED Provider Notes (Signed)
[facility]-EMERGENCY DEPT Provider Note   CSN: [id number] Arrival date & time: 04/27/21  1328     History Chief Complaint  Patient presents with   Post-op Problem    Scott Clark is a 40 y.o. male.  Patient presents to the ED with a chief complaint of abdominal pain.  He states that he has noticed a red spot on his abdomen near one of his incisions sites from recent bariatric surgery.  States that it has been getting larger and more painful over the past 4 days.  Had surgery on 9/19.  Denies any treatment PTA.  Denies n/v/d or fever.  The history is provided by the patient. No language interpreter was used.      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   Thrombocytopenia (HCC) 12/18/2017   Atrial fibrillation with RVR (HCC) 12/17/2017   Atrial fibrillation with controlled ventricular rate (HCC) 12/16/2017   Severe sleep apnea 09/14/2015   Morbid obesity (HCC) 09/14/2015   Bilateral edema of lower extremity 09/14/2015   Metabolic syndrome 09/14/2015   Tobacco abuse counseling 09/14/2015    Past Surgical History:  Procedure Laterality Date   GASTRIC ROUX-EN-Y N/A 04/08/2021   Procedure: LAPAROSCOPIC ROUX-EN-Y GASTRIC BYPASS WITH UPPER ENDOSCOPY;  Surgeon: Gaynelle Adu, MD;  Location: [facility];  Service: General;  Laterality: N/A;   KNEE SURGERY     UMBILICAL HERNIA REPAIR N/A 04/08/2021   Procedure: OPEN UMBILICAL HERNIA;  Surgeon: Gaynelle Adu, MD;  Location: [facility];  Service: General;  Laterality: N/A;   UPPER GI ENDOSCOPY N/A 04/08/2021   Procedure: UPPER GI ENDOSCOPY;  Surgeon: Gaynelle Adu, MD;  Location: [facility];  Service: General;  Laterality: N/A;       Family History  Problem Relation  Age of Onset   Hypertension Father     Social History   Tobacco Use   Smoking status: Former    Packs/day: 1.00    Years: 12.00    Pack years: 12.00    Types: Cigarettes    Quit date: 12/19/2020    Years since quitting: 0.3   Smokeless tobacco: Never  Vaping Use   Vaping Use: Former   Substances: Nicotine, Flavoring  Substance Use Topics   Alcohol use: Yes    Alcohol/week: 0.0 standard drinks    Comment: occ. use   Drug use: No    Home Medications Prior to Admission medications   Medication Sig Start Date End Date Taking? Authorizing Provider  buPROPion (WELLBUTRIN) 100 MG tablet Take 1 tablet (100 mg total) by mouth 3 (three) times daily. 04/10/21 05/10/21  Gaynelle Adu, MD  diltiazem (CARDIZEM) 60 MG tablet Take 1 tablet (60 mg total) by mouth every 6 (six) hours. 04/10/21 05/10/21  Gaynelle Adu, MD  enoxaparin (LOVENOX) 60 MG/0.6ML injection Inject 0.6 mLs (60 mg total) into the skin 2 (two) times daily for 14 days. 04/10/21 04/24/21  Gaynelle Adu, MD  metoprolol tartrate (LOPRESSOR) 100 MG tablet Take 1 tablet (100 mg total) by mouth 2 (two) times daily. 04/10/21 05/10/21  Gaynelle Adu, MD  ondansetron (ZOFRAN-ODT) 4 MG disintegrating tablet Take 1 tablet (4 mg total) by mouth every 6 (six) hours as needed  for nausea or vomiting. 04/10/21   Gaynelle Adu, MD  pantoprazole (PROTONIX) 40 MG tablet Take 1 tablet (40 mg total) by mouth daily. 04/10/21   Gaynelle Adu, MD  traMADol (ULTRAM) 50 MG tablet Take 1 tablet (50 mg total) by mouth every 6 (six) hours as needed (pain). 04/10/21   Gaynelle Adu, MD    Allergies    Patient has no known allergies.  Review of Systems   Review of Systems  All other systems reviewed and are negative.  Physical Exam Updated Vital Signs BP 119/89 (BP Location: Right Arm)   Pulse 96   Temp 98.6 F (37 C) (Oral)   Resp 20   Wt (!) 233.6 kg   SpO2 96%   BMI 66.12 kg/m   Physical Exam Vitals and nursing note reviewed.  Constitutional:       General: He is not in acute distress.    Appearance: He is well-developed. He is not ill-appearing.  HENT:     Head: Normocephalic and atraumatic.  Eyes:     Conjunctiva/sclera: Conjunctivae normal.  Cardiovascular:     Rate and Rhythm: Normal rate.  Pulmonary:     Effort: Pulmonary effort is normal. No respiratory distress.  Abdominal:     General: There is no distension.  Musculoskeletal:     Cervical back: Neck supple.     Comments: Moves all extremities  Skin:    General: Skin is warm and dry.     Comments: Soft tissue erythema and swelling of the left upper abdominal wall consistent with cellulitis  No definite signs of abscess on my exam  Neurological:     Mental Status: He is alert and oriented to person, place, and time.  Psychiatric:        Mood and Affect: Mood normal.        Behavior: Behavior normal.    ED Results / Procedures / Treatments   Labs (all labs ordered are listed, but only abnormal results are displayed) Labs Reviewed  CBC WITH DIFFERENTIAL/PLATELET - Abnormal; Notable for the following components:      Result Value   Hemoglobin 12.1 (*)    HCT 38.8 (*)    RDW 16.0 (*)    All other components within normal limits  BASIC METABOLIC PANEL - Abnormal; Notable for the following components:   Creatinine, Ser 1.36 (*)    Calcium 8.6 (*)    All other components within normal limits  LACTIC ACID, PLASMA    EKG None  Radiology No results found.  Procedures Procedures   Medications Ordered in ED Medications - No data to display  ED Course  I have reviewed the triage vital signs and the nursing notes.  Pertinent labs & imaging results that were available during my care of the patient were reviewed by me and considered in my medical decision making (see chart for details).    MDM Rules/Calculators/A&P                           Patient here with cellulitis to his left upper abdomen.  Given close proximity to recent incision, will need to  evaluate further with advanced imaging.  CT shows interval development of a 6.5 x 4.5 cm subcutaneous fluid collection, could be seroma, hematoma, or possibly abscess.  Patient seen by discussed with Dr. Dossie Der, who also aspirated the fluid collection in the ED.  Fluid was sent for culture.  Recommendation was to start the  patient on Augmentin.  I have prescribed this for the patient.  He has close follow-up with the surgical clinic.  Discharged home in good condition.  Final Clinical Impression(s) / ED Diagnoses Final diagnoses:  Postoperative hematoma of subcutaneous tissue following non-dermatologic procedure    Rx / DC Orders ED Discharge Orders          Ordered    amoxicillin-clavulanate (AUGMENTIN) 875-125 MG tablet  Every 12 hours        04/28/21 0042             Roxy Horseman, PA-C 04/28/21 0335    Long, Arlyss Repress, MD 04/28/21 2232

## 2021-04-27 NOTE — ED Provider Notes (Signed)
Emergency Medicine Provider Triage Evaluation Note  Scott Clark , a 40 y.o. male  was evaluated in triage.  Pt complains of incisional pain.  Review of Systems  Positive: Abd pain, redness, swelling Negative: Fever, n/v  Physical Exam  BP (!) 143/101   Pulse 79   Temp 98.6 F (37 C) (Oral)   Resp 18   Wt (!) 233.6 kg   SpO2 97%   BMI 66.12 kg/m  Gen:   Awake, no distress   Resp:  Normal effort  MSK:   Moves extremities without difficulty  Other:  Abdominal wall: moderate size area of erythema with firmness and tenderness to L abd wall at incision site.  Ttp.   Medical Decision Making  Medically screening exam initiated at 2:07 PM.  Appropriate orders placed.  Eugen Jeansonne was informed that the remainder of the evaluation will be completed by another provider, this initial triage assessment does not replace that evaluation, and the importance of remaining in the ED until their evaluation is complete.  Pt had bariatric surgery last month.  Noticed increasing pain, swelling and redness to one of his incision site on abdomen.  Cellulitic skin changes with firmness concerning for underlying abscess.    Fayrene Helper, PA-C 04/27/21 1412    Horton, Clabe Seal, DO 04/27/21 1609

## 2021-04-28 DIAGNOSIS — L7632 Postprocedural hematoma of skin and subcutaneous tissue following other procedure: Secondary | ICD-10-CM | POA: Diagnosis not present

## 2021-04-28 MED ORDER — AMOXICILLIN-POT CLAVULANATE 875-125 MG PO TABS: 1.0000 | ORAL_TABLET | Freq: Two times a day (BID) | ORAL | 0 refills | Status: DC

## 2021-04-28 NOTE — Progress Notes (Signed)
Scott Clark had a laparoscopic gastric bypass a few weeks ago and has developed some pain under his port site incision in the left abdomen.  Its the second left most incision.  There is a firm area beneath the incision which is tender, difficult to feel any fluctuance.  No signs of infection according the patient, however he has been having significant pain in this area.  He denies any fever.  He has been keeping his liquids in and having normal urine and bowel habits.  CT scan showed fluid collection of this area with no gas in the fluid.  I discussed this is most likely a hematoma and may resolve with time.  Aspiration could be an option if his symptoms are too severe.  It feels a few centimeters deep to the skin, and I do not suspect its infected, so I would not recommend incision and drainage at this time.  After discussing all this with the patient, he would like to proceed with aspiration.  The skin overlying this area was prepped and draped and anesthetized using local.  A 18-gauge spinal needle was inserted into the fluid collection and 30 cc of dark fluid consistent with old hematoma were aspirated from the area.  I was unable to fully resolve the firmness in this area as there was likely some clotted blood that is unable to be aspirated with this 18-gauge needle.  This should resolve with time and decompressing it some will hopefully improve his symptoms.  He has a follow-up appoint with Dr. Andrey Campanile on Tuesday.  At that point we can follow-up on the fluid culture results and decide if additional aspiration or proceeding with incision and drainage is necessary.    Discussed with the ER doc.  Plan for discharge home from the emergency room with Augmentin which we will stop if the cultures come back negative.

## 2021-04-28 NOTE — Discharge Instructions (Addendum)
Please follow-up with your surgeon as planned.  Take antibiotics as directed.  If something changes or worsens, please return to the ER.

## 2021-04-29 ENCOUNTER — Telehealth: Payer: Self-pay | Admitting: Skilled Nursing Facility1

## 2021-04-29 LAB — GRAM STAIN

## 2021-04-29 NOTE — Telephone Encounter (Signed)
RD called pt to verify fluid intake once starting soft, solid proteins 2 week post-bariatric surgery.   Daily Fluid intake: 45-50 oz Daily Protein intake: 80 g Bowel Habits: every day to every other day; stating they are a bit more firm than used to taking mirilax  Concerns/issues:    Feels like he is doing fine.

## 2021-04-30 LAB — BODY FLUID CULTURE W GRAM STAIN

## 2021-05-01 ENCOUNTER — Telehealth: Payer: Self-pay | Admitting: Emergency Medicine

## 2021-05-01 ENCOUNTER — Ambulatory Visit: Payer: Self-pay | Admitting: General Surgery

## 2021-05-01 NOTE — H&P (View-Only) (Signed)
PROVIDER:  Daleysa Kristiansen Sherril Cong, MD   MRN: [id number] DOB: 11-Nov-1980 DATE OF ENCOUNTER: 05/01/2021 Interval History:    He comes in for follow-up after undergoing laparoscopic Roux-en-Y gastric bypass and open primary umbilical hernia repair on September 9 18th.  He was discharged on the 21st.  His postoperative course was unremarkable until he developed some pain and redness around one of the trocar sites prompting him to go to the emergency room over the weekend.  He was found to have a 6.5 x 4.5 left lower quadrant abdominal wall hematoma.  The on-call surgeon saw him and aspirated and put him on Augmentin.  There were no intra-abdominal signs of complications.  He did go out on 2 weeks of Lovenox injections.  He states that his Lovenox injections were further down.  He reports that he is getting in about 50 ounces of liquid per day.  Transition to proteins has gone well.  No particular food intolerances.  He is taking his multivitamin and calcium.  He is taking his blood pressure pills.  He states that he is having some fatigue and some soreness around that area of infection.  He is taking his antibiotic.  He is accompanied by his wife today No fever or chills Physical Examination:    Physical Exam  BP (!) 142/88   Pulse 97   Temp 37.1 C (98.7 F)   Ht 188 cm (6\' 2" )   Wt (!) 228.8 kg (504 lb 6.4 oz)   SpO2 100%   BMI 64.76 kg/m  Gen: alert, NAD, non-toxic appearing Pupils: equal, no scleral icterus Pulm: symmetric chest rise Abd: soft, nontender, nondistended. Well-healed trocar sites.  Around the left mid abdominal wall trocar site there is about 14 x 15 area of induration and cellulitis and firmness.  No incisional hernia Skin: no rash, no jaundice       Assessment and Plan:    BENZION MESTA is a 40 y.o. male who underwent laparoscopic Roux-en-Y gastric bypass and open umbilical hernia repair on September 19.   Diagnoses and all orders for this visit:   Gastric bypass status  for obesity   Severe obesity (CMS-HCC)   Infected hematoma       Overall he is doing well with exception of his surgical site wound infection.  The culture grew back strep.  I am not optimistic that antibiotics alone will be sufficient.  There is still a fair amount of induration and cellulitis.  Without no fever or signs of worsening infection I just do not think this is going to get better without incision and drainage.  I think it is a little bit too much to do in the clinic today.  I recommended going back to the operating room for incision and drainage.  We discussed what that would involve.  We discussed that it would require daily dressing change.   His fluid intake is good.  We did discuss the typical issues that we see at the first postop appointment.  I think his fatigue will get better as he gets further out from surgery.  We discussed the importance of taking his multivitamin and calcium.  He completed his postoperative Lovenox DVT prophylaxis.  His labs have been monitored twice with no signs change of his renal function.  Blood count is stable for him.   Continue home blood pressure medicines for now.  Patient was encouraged to follow-up with his PCP.   I congratulated him on his weight loss.  He  is down 46 pounds since his preoperative visit.  Total weight change has been 8.5% since surgery   No follow-ups on file.     The plan was discussed in detail with the patient today, who expressed understanding.  The patient has my contact information, and understands to call me with any additional questions or concerns in the interval.  I would be happy to see the patient back sooner if the need arises.   Mary Sella. Andrey Campanile, MD, FACS General, Bariatric, & Minimally Invasive Surgery [facility], [place]

## 2021-05-01 NOTE — H&P (Signed)
PROVIDER:  Evangela Heffler Sherril Cong, MD   MRN: [id number] DOB: 11-Nov-1980 DATE OF ENCOUNTER: 05/01/2021 Interval History:    He comes in for follow-up after undergoing laparoscopic Roux-en-Y gastric bypass and open primary umbilical hernia repair on September 9 18th.  He was discharged on the 21st.  His postoperative course was unremarkable until he developed some pain and redness around one of the trocar sites prompting him to go to the emergency room over the weekend.  He was found to have a 6.5 x 4.5 left lower quadrant abdominal wall hematoma.  The on-call surgeon saw him and aspirated and put him on Augmentin.  There were no intra-abdominal signs of complications.  He did go out on 2 weeks of Lovenox injections.  He states that his Lovenox injections were further down.  He reports that he is getting in about 50 ounces of liquid per day.  Transition to proteins has gone well.  No particular food intolerances.  He is taking his multivitamin and calcium.  He is taking his blood pressure pills.  He states that he is having some fatigue and some soreness around that area of infection.  He is taking his antibiotic.  He is accompanied by his wife today No fever or chills Physical Examination:    Physical Exam  BP (!) 142/88   Pulse 97   Temp 37.1 C (98.7 F)   Ht 188 cm (6\' 2" )   Wt (!) 228.8 kg (504 lb 6.4 oz)   SpO2 100%   BMI 64.76 kg/m  Gen: alert, NAD, non-toxic appearing Pupils: equal, no scleral icterus Pulm: symmetric chest rise Abd: soft, nontender, nondistended. Well-healed trocar sites.  Around the left mid abdominal wall trocar site there is about 14 x 15 area of induration and cellulitis and firmness.  No incisional hernia Skin: no rash, no jaundice       Assessment and Plan:    Scott Clark is a 40 y.o. male who underwent laparoscopic Roux-en-Y gastric bypass and open umbilical hernia repair on September 19.   Diagnoses and all orders for this visit:   Gastric bypass status  for obesity   Severe obesity (CMS-HCC)   Infected hematoma       Overall he is doing well with exception of his surgical site wound infection.  The culture grew back strep.  I am not optimistic that antibiotics alone will be sufficient.  There is still a fair amount of induration and cellulitis.  Without no fever or signs of worsening infection I just do not think this is going to get better without incision and drainage.  I think it is a little bit too much to do in the clinic today.  I recommended going back to the operating room for incision and drainage.  We discussed what that would involve.  We discussed that it would require daily dressing change.   His fluid intake is good.  We did discuss the typical issues that we see at the first postop appointment.  I think his fatigue will get better as he gets further out from surgery.  We discussed the importance of taking his multivitamin and calcium.  He completed his postoperative Lovenox DVT prophylaxis.  His labs have been monitored twice with no signs change of his renal function.  Blood count is stable for him.   Continue home blood pressure medicines for now.  Patient was encouraged to follow-up with his PCP.   I congratulated him on his weight loss.  He  is down 46 pounds since his preoperative visit.  Total weight change has been 8.5% since surgery   No follow-ups on file.     The plan was discussed in detail with the patient today, who expressed understanding.  The patient has my contact information, and understands to call me with any additional questions or concerns in the interval.  I would be happy to see the patient back sooner if the need arises.   Mary Sella. Andrey Campanile, MD, FACS General, Bariatric, & Minimally Invasive Surgery [facility], [place]

## 2021-05-01 NOTE — Telephone Encounter (Signed)
Post ED Visit - Positive Culture Follow-up  Culture report reviewed by antimicrobial stewardship pharmacist: [facility] []  , Pharm.D. []  Enzo Bi, Pharm.D., BCPS AQ-ID []  , Pharm.D., BCPS []  Celedonio Miyamoto, Pharm.D., BCPS []  [place], Garvin Fila.D., BCPS, AAHIVP []  , Pharm.D., BCPS, AAHIVP []  [place]ina Pillion, PharmD, BCPS []  , PharmD, BCPS []  [place], PharmD, BCPS []  [place], PharmD []  , PharmD, BCPS []  Estella Husk, PharmD  Pharmacy Team []  Lysle Pearl, PharmD []  , PharmD []  Phillips Climes, PharmD []  , Rph []  Agapito Games) , PharmD []  Verlan Friends, PharmD []  , PharmD []  Mervyn Gay, PharmD []  , PharmD []  Vinnie Level, PharmD []  [facility], PharmD []  , PharmD [x]  Len Childs, PharmD   Positive Body fluid culture Treated with Amoxicillin, organism sensitive to the same and no further patient follow-up is required at this time.  Shuntell Foody 05/01/2021, 10:50 AM

## 2021-05-01 NOTE — Progress Notes (Addendum)
COVID swab appointment: n/a  COVID Vaccine Completed:no Date COVID Vaccine completed: Has received booster: COVID vaccine manufacturer: Pf[employer]    [employer]   Date of COVID positive in last 90 days: no  PCP - Assunta Found, MD Cardiologist -   Chest x-ray - 08/20/20 Epic EKG - 04/08/21 Epic Stress Test - n/a ECHO - 09/17/20 Epic Cardiac Cath - n/a Pacemaker/ICD device last checked: n/a Spinal Cord Stimulator: n/a  Sleep Study - yes positive CPAP - yes every night  Fasting Blood Sugar - n/a Checks Blood Sugar _____ times a day  Blood Thinner Instructions: Lovenox, stopped 1 week ago Aspirin Instructions: Last Dose:   Activity level:  Can go up a flight of stairs and perform activities of daily living without stopping and without symptoms of chest pain or shortness of breath. SOB        Anesthesia review: HTN, A fib, OSA, no clearance  Patient denies shortness of breath, fever, cough and chest pain at PAT appointment   Patient verbalized understanding of instructions that were given to them at the PAT appointment. Patient was also instructed that they will need to review over the PAT instructions again at home before surgery.

## 2021-05-01 NOTE — Patient Instructions (Addendum)
DUE TO COVID-19 ONLY ONE VISITOR IS ALLOWED TO COME WITH YOU AND STAY IN THE WAITING ROOM ONLY DURING PRE OP AND PROCEDURE.   **NO VISITORS ARE ALLOWED IN THE SHORT STAY AREA OR RECOVERY ROOM!!**       Your procedure is scheduled on: 05/03/21   Report to [facility] Main Entrance    Report to admitting at 7:15 AM   Call this number if you have problems the morning of surgery [phone]   Do not eat food :After Midnight.   May have liquids until 6:30 AM day of surgery  CLEAR LIQUID DIET  Foods Allowed                                                                     Foods Excluded  Water, Black Coffee and tea (no milk or creamer)           liquids that you cannot  Plain Jell-O in any flavor  (No red)                                    see through such as: Fruit ices (not with fruit pulp)                                           milk, soups, orange juice              Iced Popsicles (No red)                                               All solid food                                   Apple juices Sports drinks like Gatorade (No red) Lightly seasoned clear broth or consume(fat free) Sugar   Oral Hygiene is also important to reduce your risk of infection.                                    Remember - BRUSH YOUR TEETH THE MORNING OF SURGERY WITH YOUR REGULAR TOOTHPASTE   Take these medicines the morning of surgery with A SIP OF WATER: Augmentin, Wellbutrin, Diltiazem, Metoprolol, Protonix, Tramadol                               You may not have any metal on your body including jewelry, and body piercing             Do not wear lotions, powders, cologne, or deodorant              Men may shave face and neck.   Do not bring valuables to the hospital. [facility] IS NOT  RESPONSIBLE   FOR VALUABLES.    Patients discharged on the day of surgery will not be allowed to drive home.  Special Instructions: Bring a copy of your healthcare power of attorney and  living will documents         the day of surgery if you haven't scanned them before.   Please read over the following fact sheets you were given: IF YOU HAVE QUESTIONS ABOUT YOUR PRE-OP INSTRUCTIONS PLEASE CALL [phone]- [facility] - Preparing for Surgery Before surgery, you can play an important role.  Because skin is not sterile, your skin needs to be as free of germs as possible.  You can reduce the number of germs on your skin by washing with CHG (chlorahexidine gluconate) soap before surgery.  CHG is an antiseptic cleaner which kills germs and bonds with the skin to continue killing germs even after washing. Please DO NOT use if you have an allergy to CHG or antibacterial soaps.  If your skin becomes reddened/irritated stop using the CHG and inform your nurse when you arrive at Short Stay. Do not shave (including legs and underarms) for at least 48 hours prior to the first CHG shower.  You may shave your face/neck.  Please follow these instructions carefully:  1.  Shower with CHG Soap the night before surgery and the  morning of surgery.  2.  If you choose to wash your hair, wash your hair first as usual with your normal  shampoo.  3.  After you shampoo, rinse your hair and body thoroughly to remove the shampoo.                             4.  Use CHG as you would any other liquid soap.  You can apply chg directly to the skin and wash.  Gently with a scrungie or clean washcloth.  5.  Apply the CHG Soap to your body ONLY FROM THE NECK DOWN.   Do   not use on face/ open                           Wound or open sores. Avoid contact with eyes, ears mouth and   genitals (private parts).                       Wash face,  Genitals (private parts) with your normal soap.             6.  Wash thoroughly, paying special attention to the area where your    surgery  will be performed.  7.  Thoroughly rinse your body with warm water from the neck down.  8.  DO NOT shower/wash with your normal  soap after using and rinsing off the CHG Soap.                9.  Pat yourself dry with a clean towel.            10.  Wear clean pajamas.            11.  Place clean sheets on your bed the night of your first shower and do not  sleep with pets. Day of Surgery : Do not apply any lotions/deodorants the morning of surgery.  Please wear clean clothes to the hospital/surgery center.  FAILURE TO FOLLOW THESE INSTRUCTIONS MAY RESULT IN THE CANCELLATION  OF YOUR SURGERY  PATIENT SIGNATURE_________________________________  NURSE SIGNATURE__________________________________  ________________________________________________________________________

## 2021-05-02 ENCOUNTER — Encounter (HOSPITAL_COMMUNITY)
Admission: RE | Admit: 2021-05-02 | Discharge: 2021-05-02 | Disposition: A | Discharge status: Home / Self Care | Payer: PRIVATE HEALTH INSURANCE | Source: Ambulatory Visit | Attending: General Surgery | Admitting: General Surgery

## 2021-05-02 ENCOUNTER — Encounter (HOSPITAL_COMMUNITY): Payer: Self-pay

## 2021-05-02 DIAGNOSIS — Z01812 Encounter for preprocedural laboratory examination: Secondary | ICD-10-CM | POA: Insufficient documentation

## 2021-05-02 HISTORY — DX: Pneumonia, unspecified organism: J18.9

## 2021-05-02 LAB — BASIC METABOLIC PANEL
Anion gap: 11 (ref 5–15)
BUN: 16 mg/dL (ref 6–20)
CO2: 24 mmol/L (ref 22–32)
Calcium: 8.8 mg/dL — ABNORMAL LOW (ref 8.9–10.3)
Chloride: 105 mmol/L (ref 98–111)
Creatinine, Ser: 1.14 mg/dL (ref 0.61–1.24)
GFR, Estimated: >60 mL/min (ref >60)
Glucose, Bld: 78 mg/dL (ref 70–99)
Potassium: 3.9 mmol/L (ref 3.5–5.1)
Sodium: 140 mmol/L (ref 135–145)

## 2021-05-02 LAB — CBC
HCT: 37.5 % — ABNORMAL LOW (ref 39.0–52.0)
Hemoglobin: 11.7 g/dL — ABNORMAL LOW (ref 13.0–17.0)
MCH: 27.3 pg (ref 26.0–34.0)
MCHC: 31.2 g/dL (ref 30.0–36.0)
MCV: 87.6 fL (ref 80.0–100.0)
Platelets: 210 10*3/uL (ref 150–400)
RBC: 4.28 MIL/uL (ref 4.22–5.81)
RDW: 16.2 % — ABNORMAL HIGH (ref 11.5–15.5)
WBC: 6.3 10*3/uL (ref 4.0–10.5)
nRBC: 0 % (ref 0.0–0.2)

## 2021-05-02 LAB — PROTIME-INR
INR: 1.2 (ref 0.8–1.2)
Prothrombin Time: 15.2 seconds (ref 11.4–15.2)

## 2021-05-02 NOTE — Anesthesia Preprocedure Evaluation (Addendum)
Anesthesia Evaluation  Patient identified by MRN, date of birth, ID band Patient awake    Reviewed: Allergy & Precautions, NPO status , Patient's Chart, lab work & pertinent test results, reviewed documented beta blocker date and time   History of Anesthesia Complications Negative for: history of anesthetic complications  Airway Mallampati: III  TM Distance: <3 FB Neck ROM: Full    Dental no notable dental hx.    Pulmonary sleep apnea , former smoker,    Pulmonary exam normal breath sounds clear to auscultation       Cardiovascular hypertension, Pt. on medications and Pt. on home beta blockers Normal cardiovascular exam+ dysrhythmias Atrial Fibrillation  Rhythm:Regular Rate:Normal     Neuro/Psych negative neurological ROS  negative psych ROS   GI/Hepatic Neg liver ROS, GERD  Medicated,  Endo/Other  Morbid obesity (BMI 64)  Renal/GU negative Renal ROS  negative genitourinary   Musculoskeletal negative musculoskeletal ROS (+)   Abdominal   Peds negative pediatric ROS (+)  Hematology negative hematology ROS (+) anemia , Hgb 11.7   Anesthesia Other Findings Day of surgery medications reviewed with patient.  Reproductive/Obstetrics negative OB ROS                            Anesthesia Physical Anesthesia Plan  ASA: 4  Anesthesia Plan: General   Post-op Pain Management:    Induction: Intravenous  PONV Risk Score and Plan: 2 and Treatment may vary due to age or medical condition, Ondansetron, Dexamethasone and Midazolam  Airway Management Planned: Oral ETT and Video Laryngoscope Planned  Additional Equipment: None  Intra-op Plan:   Post-operative Plan: Extubation in OR  Informed Consent: I have reviewed the patients History and Physical, chart, labs and discussed the procedure including the risks, benefits and alternatives for the proposed anesthesia with the patient or authorized  representative who has indicated his/her understanding and acceptance.     Dental advisory given  Plan Discussed with: CRNA and Surgeon  Anesthesia Plan Comments:        Anesthesia Quick Evaluation

## 2021-05-03 ENCOUNTER — Encounter (HOSPITAL_COMMUNITY)
Admission: RE | Disposition: A | Discharge status: Home / Self Care | Payer: Self-pay | Source: Home / Self Care | Attending: General Surgery

## 2021-05-03 ENCOUNTER — Ambulatory Visit (HOSPITAL_COMMUNITY): Payer: PRIVATE HEALTH INSURANCE | Admitting: Physician Assistant

## 2021-05-03 ENCOUNTER — Ambulatory Visit (HOSPITAL_COMMUNITY)
Admission: RE | Admit: 2021-05-03 | Discharge: 2021-05-03 | Disposition: A | Discharge status: Home / Self Care | Payer: PRIVATE HEALTH INSURANCE | Attending: General Surgery | Admitting: General Surgery

## 2021-05-03 ENCOUNTER — Encounter (HOSPITAL_COMMUNITY): Payer: Self-pay | Admitting: General Surgery

## 2021-05-03 DIAGNOSIS — L7632 Postprocedural hematoma of skin and subcutaneous tissue following other procedure: Secondary | ICD-10-CM | POA: Diagnosis not present

## 2021-05-03 DIAGNOSIS — X58XXXA Exposure to other specified factors, initial encounter: Secondary | ICD-10-CM | POA: Insufficient documentation

## 2021-05-03 DIAGNOSIS — Z9884 Bariatric surgery status: Secondary | ICD-10-CM | POA: Insufficient documentation

## 2021-05-03 DIAGNOSIS — T8141XA Infection following a procedure, superficial incisional surgical site, initial encounter: Secondary | ICD-10-CM | POA: Insufficient documentation

## 2021-05-03 DIAGNOSIS — Z6841 Body Mass Index (BMI) 40.0 and over, adult: Secondary | ICD-10-CM | POA: Insufficient documentation

## 2021-05-03 DIAGNOSIS — Y838 Other surgical procedures as the cause of abnormal reaction of the patient, or of later complication, without mention of misadventure at the time of the procedure: Secondary | ICD-10-CM | POA: Diagnosis not present

## 2021-05-03 HISTORY — PX: INCISION AND DRAINAGE ABSCESS: SHX5864

## 2021-05-03 LAB — PROTIME-INR
INR: 1.2 (ref 0.8–1.2)
Prothrombin Time: 14.9 seconds (ref 11.4–15.2)

## 2021-05-03 LAB — APTT: aPTT: 34 seconds (ref 24–36)

## 2021-05-03 SURGERY — INCISION AND DRAINAGE, ABSCESS
Anesthesia: General | Site: Abdomen

## 2021-05-03 MED ORDER — FENTANYL CITRATE PF 50 MCG/ML IJ SOSY
PREFILLED_SYRINGE | INTRAMUSCULAR | Status: AC
  Administered 2021-05-03: 25 ug via INTRAVENOUS
  Filled 2021-05-03: qty 1

## 2021-05-03 MED ORDER — ROCURONIUM BROMIDE 10 MG/ML (PF) SYRINGE
PREFILLED_SYRINGE | INTRAVENOUS | Status: AC
  Filled 2021-05-03: qty 10

## 2021-05-03 MED ORDER — CHLORHEXIDINE GLUCONATE 0.12 % MT SOLN: 15.0000 mL | Freq: Once | OROMUCOSAL | Status: DC

## 2021-05-03 MED ORDER — MIDAZOLAM HCL 2 MG/2ML IJ SOLN
INTRAMUSCULAR | Status: AC
  Filled 2021-05-03: qty 2

## 2021-05-03 MED ORDER — FENTANYL CITRATE (PF) 250 MCG/5ML IJ SOLN
INTRAMUSCULAR | Status: AC
  Filled 2021-05-03: qty 5

## 2021-05-03 MED ORDER — PROPOFOL 10 MG/ML IV BOLUS
INTRAVENOUS | Status: AC
  Filled 2021-05-03: qty 40

## 2021-05-03 MED ORDER — METOPROLOL TARTRATE 50 MG PO TABS
100.0000 mg | ORAL_TABLET | ORAL | Status: AC
  Administered 2021-05-03: 100 mg via ORAL
  Filled 2021-05-03: qty 2

## 2021-05-03 MED ORDER — LIDOCAINE 2% (20 MG/ML) 5 ML SYRINGE
INTRAMUSCULAR | Status: DC | PRN
  Administered 2021-05-03: 100 mg via INTRAVENOUS

## 2021-05-03 MED ORDER — LIDOCAINE HCL (PF) 2 % IJ SOLN
INTRAMUSCULAR | Status: AC
  Filled 2021-05-03: qty 5

## 2021-05-03 MED ORDER — DEXAMETHASONE SODIUM PHOSPHATE 10 MG/ML IJ SOLN
INTRAMUSCULAR | Status: DC | PRN
  Administered 2021-05-03: 10 mg via INTRAVENOUS

## 2021-05-03 MED ORDER — LABETALOL HCL 5 MG/ML IV SOLN
INTRAVENOUS | Status: DC | PRN
  Administered 2021-05-03: 10 mg via INTRAVENOUS

## 2021-05-03 MED ORDER — CEFAZOLIN IN SODIUM CHLORIDE 3-0.9 GM/100ML-% IV SOLN
3.0000 g | INTRAVENOUS | Status: AC
  Administered 2021-05-03: 3 g via INTRAVENOUS
  Filled 2021-05-03: qty 100

## 2021-05-03 MED ORDER — SUCCINYLCHOLINE CHLORIDE 200 MG/10ML IV SOSY
PREFILLED_SYRINGE | INTRAVENOUS | Status: AC
  Filled 2021-05-03: qty 10

## 2021-05-03 MED ORDER — 0.9 % SODIUM CHLORIDE (POUR BTL) OPTIME
TOPICAL | Status: DC | PRN
  Administered 2021-05-03: 1000 mL

## 2021-05-03 MED ORDER — OXYCODONE HCL 5 MG PO TABS: 5.0000 mg | ORAL_TABLET | Freq: Four times a day (QID) | ORAL | 0 refills | Status: DC | PRN

## 2021-05-03 MED ORDER — ONDANSETRON HCL 4 MG/2ML IJ SOLN
INTRAMUSCULAR | Status: DC | PRN
  Administered 2021-05-03: 4 mg via INTRAVENOUS

## 2021-05-03 MED ORDER — FENTANYL CITRATE PF 50 MCG/ML IJ SOSY
25.0000 ug | PREFILLED_SYRINGE | INTRAMUSCULAR | Status: DC | PRN
  Administered 2021-05-03: 25 ug via INTRAVENOUS

## 2021-05-03 MED ORDER — ACETAMINOPHEN 500 MG PO TABS: 1000.0000 mg | ORAL_TABLET | ORAL | Status: DC

## 2021-05-03 MED ORDER — DEXAMETHASONE SODIUM PHOSPHATE 10 MG/ML IJ SOLN
INTRAMUSCULAR | Status: AC
  Filled 2021-05-03: qty 1

## 2021-05-03 MED ORDER — AMOXICILLIN-POT CLAVULANATE 875-125 MG PO TABS: 1.0000 | ORAL_TABLET | Freq: Two times a day (BID) | ORAL | 0 refills | Status: AC

## 2021-05-03 MED ORDER — ORAL CARE MOUTH RINSE: 15.0000 mL | Freq: Once | OROMUCOSAL | Status: DC

## 2021-05-03 MED ORDER — OXYCODONE HCL 5 MG PO TABS
ORAL_TABLET | ORAL | Status: AC
  Administered 2021-05-03: 5 mg via ORAL
  Filled 2021-05-03: qty 1

## 2021-05-03 MED ORDER — CHLORHEXIDINE GLUCONATE CLOTH 2 % EX PADS: 6.0000 | MEDICATED_PAD | Freq: Once | CUTANEOUS | Status: DC

## 2021-05-03 MED ORDER — LACTATED RINGERS IV SOLN: INTRAVENOUS | Status: DC

## 2021-05-03 MED ORDER — DILTIAZEM HCL 60 MG PO TABS
60.0000 mg | ORAL_TABLET | ORAL | Status: AC
  Administered 2021-05-03: 60 mg via ORAL
  Filled 2021-05-03: qty 1

## 2021-05-03 MED ORDER — PHENYLEPHRINE 40 MCG/ML (10ML) SYRINGE FOR IV PUSH (FOR BLOOD PRESSURE SUPPORT)
PREFILLED_SYRINGE | INTRAVENOUS | Status: AC
  Filled 2021-05-03: qty 10

## 2021-05-03 MED ORDER — OXYCODONE HCL 5 MG/5ML PO SOLN: 5.0000 mg | Freq: Once | ORAL | Status: AC | PRN

## 2021-05-03 MED ORDER — MIDAZOLAM HCL 5 MG/5ML IJ SOLN
INTRAMUSCULAR | Status: DC | PRN
  Administered 2021-05-03: 2 mg via INTRAVENOUS

## 2021-05-03 MED ORDER — ACETAMINOPHEN 500 MG PO TABS
1000.0000 mg | ORAL_TABLET | Freq: Once | ORAL | Status: AC
  Administered 2021-05-03: 1000 mg via ORAL
  Filled 2021-05-03: qty 2

## 2021-05-03 MED ORDER — LABETALOL HCL 5 MG/ML IV SOLN
INTRAVENOUS | Status: AC
  Filled 2021-05-03: qty 4

## 2021-05-03 MED ORDER — ONDANSETRON HCL 4 MG/2ML IJ SOLN
INTRAMUSCULAR | Status: AC
  Filled 2021-05-03: qty 2

## 2021-05-03 MED ORDER — PROPOFOL 10 MG/ML IV BOLUS
INTRAVENOUS | Status: DC | PRN
  Administered 2021-05-03: 270 mg via INTRAVENOUS

## 2021-05-03 MED ORDER — FENTANYL CITRATE (PF) 100 MCG/2ML IJ SOLN
INTRAMUSCULAR | Status: DC | PRN
  Administered 2021-05-03: 150 ug via INTRAVENOUS
  Administered 2021-05-03: 50 ug via INTRAVENOUS

## 2021-05-03 MED ORDER — SUCCINYLCHOLINE CHLORIDE 200 MG/10ML IV SOSY
PREFILLED_SYRINGE | INTRAVENOUS | Status: DC | PRN
  Administered 2021-05-03: 200 mg via INTRAVENOUS

## 2021-05-03 MED ORDER — OXYCODONE HCL 5 MG PO TABS
5.0000 mg | ORAL_TABLET | Freq: Once | ORAL | Status: AC | PRN
Start: 2021-05-03 — End: 2021-05-03

## 2021-05-03 MED ORDER — PROMETHAZINE HCL 25 MG/ML IJ SOLN: 6.2500 mg | INTRAMUSCULAR | Status: DC | PRN

## 2021-05-03 MED ORDER — PHENYLEPHRINE 40 MCG/ML (10ML) SYRINGE FOR IV PUSH (FOR BLOOD PRESSURE SUPPORT)
PREFILLED_SYRINGE | INTRAVENOUS | Status: DC | PRN
  Administered 2021-05-03 (×2): 40 ug via INTRAVENOUS

## 2021-05-03 SURGICAL SUPPLY — 39 items
BAG COUNTER SPONGE SURGICOUNT (BAG) IMPLANT
BNDG CONFORM 4 STRL LF (GAUZE/BANDAGES/DRESSINGS) ×2 IMPLANT
BNDG GAUZE ELAST 4 BULKY (GAUZE/BANDAGES/DRESSINGS) IMPLANT
COVER SURGICAL LIGHT HANDLE (MISCELLANEOUS) ×2 IMPLANT
DECANTER SPIKE VIAL GLASS SM (MISCELLANEOUS) IMPLANT
DERMABOND ADVANCED (GAUZE/BANDAGES/DRESSINGS)
DERMABOND ADVANCED .7 DNX12 (GAUZE/BANDAGES/DRESSINGS) IMPLANT
DRAPE LAPAROSCOPIC ABDOMINAL (DRAPES) ×2 IMPLANT
DRAPE LAPAROTOMY T 102X78X121 (DRAPES) IMPLANT
DRAPE LAPAROTOMY T 98X78 PEDS (DRAPES) IMPLANT
DRAPE LAPAROTOMY TRNSV 102X78 (DRAPES) IMPLANT
DRAPE SHEET LG 3/4 BI-LAMINATE (DRAPES) IMPLANT
DRAPE UTILITY XL STRL (DRAPES) IMPLANT
DRSG PAD ABDOMINAL 8X10 ST (GAUZE/BANDAGES/DRESSINGS) ×2 IMPLANT
ELECT REM PT RETURN 15FT ADLT (MISCELLANEOUS) ×2 IMPLANT
GAUZE PACKING IODOFORM 1/4X15 (PACKING) IMPLANT
GAUZE SPONGE 4X4 12PLY STRL (GAUZE/BANDAGES/DRESSINGS) ×2 IMPLANT
GLOVE SRG 8 PF TXTR STRL LF DI (GLOVE) ×1 IMPLANT
GLOVE SURG POLY ORTHO LF SZ7.5 (GLOVE) ×2 IMPLANT
GLOVE SURG UNDER POLY LF SZ8 (GLOVE) ×1
GOWN STRL REUS W/TWL XL LVL3 (GOWN DISPOSABLE) ×6 IMPLANT
HANDPIECE INTERPULSE COAX TIP (DISPOSABLE)
KIT BASIN OR (CUSTOM PROCEDURE TRAY) ×2 IMPLANT
KIT TURNOVER KIT A (KITS) ×2 IMPLANT
MARKER SKIN DUAL TIP RULER LAB (MISCELLANEOUS) IMPLANT
NEEDLE HYPO 25X1 1.5 SAFETY (NEEDLE) IMPLANT
PACK GENERAL/GYN (CUSTOM PROCEDURE TRAY) ×2 IMPLANT
SET HNDPC FAN SPRY TIP SCT (DISPOSABLE) IMPLANT
SPONGE T-LAP 18X18 ~~LOC~~+RFID (SPONGE) ×2 IMPLANT
SPONGE T-LAP 4X18 ~~LOC~~+RFID (SPONGE) IMPLANT
STAPLER VISISTAT 35W (STAPLE) IMPLANT
SUT MNCRL AB 4-0 PS2 18 (SUTURE) IMPLANT
SUT VIC AB 3-0 SH 18 (SUTURE) IMPLANT
SWAB COLLECTION DEVICE MRSA (MISCELLANEOUS) ×2 IMPLANT
SWAB CULTURE ESWAB REG 1ML (MISCELLANEOUS) ×2 IMPLANT
SYR CONTROL 10ML LL (SYRINGE) IMPLANT
TAPE CLOTH SURG 6X10 WHT LF (GAUZE/BANDAGES/DRESSINGS) ×2 IMPLANT
TOWEL OR 17X26 10 PK STRL BLUE (TOWEL DISPOSABLE) ×2 IMPLANT
TOWEL OR NON WOVEN STRL DISP B (DISPOSABLE) ×2 IMPLANT

## 2021-05-03 NOTE — Anesthesia Procedure Notes (Signed)
Procedure Name: Intubation Date/Time: 05/03/2021 9:47 AM Performed by: Orest Dikes, CRNA Pre-anesthesia Checklist: Patient identified, Emergency Drugs available, Suction available and Patient being monitored Patient Re-evaluated:Patient Re-evaluated prior to induction Oxygen Delivery Method: Circle system utilized Preoxygenation: Pre-oxygenation with 100% oxygen Induction Type: IV induction Ventilation: Mask ventilation without difficulty Laryngoscope Size: Glidescope and 4 Grade View: Grade I Tube type: Oral Tube size: 7.5 mm Number of attempts: 1 Airway Equipment and Method: Stylet and Patient positioned with wedge pillow Placement Confirmation: ETT inserted through vocal cords under direct vision, positive ETCO2 and breath sounds checked- equal and bilateral Secured at: 22 cm Tube secured with: Tape Dental Injury: Teeth and Oropharynx as per pre-operative assessment  Difficulty Due To: Difficulty was anticipated, Difficult Airway- due to limited oral opening and Difficult Airway- due to large tongue Comments: Elective Glidescope intubation due to documentation with prior intubation a few weeks ago. Wedge pillow utilized and pre-op prior to induction. Easy mask without use of oral airway, DL x 1 with Glidescope 4 and ETT 7.5 placed with ease.

## 2021-05-03 NOTE — Transfer of Care (Signed)
Immediate Anesthesia Transfer of Care Note  Patient: Scott Clark  Procedure(s) Performed: INCISION AND DRAINAGE INFECTED HEMATOMA ABDOMINAL WALL (Abdomen)  Patient Location: PACU  Anesthesia Type:General  Level of Consciousness: awake, alert  and oriented  Airway & Oxygen Therapy: Patient Spontanous Breathing and Patient connected to face mask oxygen  Post-op Assessment: Report given to RN and Post -op Vital signs reviewed and stable  Post vital signs: Reviewed and stable  Last Vitals:  Vitals Value Taken Time  BP 116/77 05/03/21 1030  Temp    Pulse 102 05/03/21 1033  Resp 23 05/03/21 1033  SpO2 99 % 05/03/21 1033  Vitals shown include unvalidated device data.  Last Pain:  Vitals:   05/03/21 0800  TempSrc:   PainSc: 0-No pain      Patients Stated Pain Goal: 2 (05/03/21 0800)  Complications: No notable events documented.

## 2021-05-03 NOTE — Discharge Instructions (Signed)
Continue to follow your bariatric surgery discharge instructions  Take antibiotic as prescribed  WOUND CARE  It is important that the wound be kept open.   -Keeping the skin edges apart will allow the wound to gradually heal from the base upwards.   - If the skin edges of the wound close too early, a new fluid pocket can form and infection can occur. -This is the reason to pack deeper wounds with gauze or ribbon -This is why drained wounds cannot be sewed closed right away  A healthy wound should form a lining of bright red "beefy" granulating tissue that will help shrink the wound and help the edges grow new skin into it.   -A little mucus / yellow discharge is normal (the body's natural way to try and form a scab) and should be gently washed off with soap and water with daily dressing changes.  -Green or foul smelling drainage implies bacterial colonization and can slow wound healing - a short course of antibiotic ointment (3-5 days) can help it clear up.  Call the doctor if it does not improve or worsens  -Avoid use of antibiotic ointments for more than a week as they can slow wound healing over time.    -Sometimes other wound care products will be used to reduce need for dressing changes and/or help clean up dirty wounds -Sometimes the surgeon needs to debride the wound in the office to remove dead or infected tissue out of the wound so it can heal more quickly and safely.    Change the dressing at least once a day -Wash the wound with mild soap and water gently every day.  It is good to shower or bathe the wound to help it clean out. -Use clean 4x4 gauze for medium/large wounds or ribbon plain NU-gauze for smaller wounds (it does not need to be sterile, just clean) -Keep the raw wound moist with a little saline or KY (saline) gel on the gauze.  -A dry wound will take longer to heal.  -Keep the skin dry around the wound to prevent breakdown and irritation. -Pack the wound down to the  base -The goal is to keep the skin apart, not overpack the wound -Use a Q-tip or blunt-tipped kabob stick toothpick to push the gauze down to the base in narrow or deep wounds   -Cover with a clean gauze and tape -paper or Medipore tape tend to be gentle on the skin -rotate the orientation of the tape to avoid repeated stress/trauma on the skin -using an ACE or Coban wrap on wounds on arms or legs can be used instead.  Complete all antibiotics through the entire prescription to help the infection heal and prevent new places of infection   Returning the see the surgeon is helpful to follow the healing process and help the wound close as fast as possible.

## 2021-05-03 NOTE — Anesthesia Postprocedure Evaluation (Signed)
Anesthesia Post Note  Patient: Scott Clark  Procedure(s) Performed: INCISION AND DRAINAGE INFECTED HEMATOMA ABDOMINAL WALL (Abdomen)     Patient location during evaluation: PACU Anesthesia Type: General Level of consciousness: awake and alert Pain management: pain level controlled Vital Signs Assessment: post-procedure vital signs reviewed and stable Respiratory status: spontaneous breathing, nonlabored ventilation, respiratory function stable and patient connected to nasal cannula oxygen Cardiovascular status: blood pressure returned to baseline and stable Postop Assessment: no apparent nausea or vomiting Anesthetic complications: no   No notable events documented.  Last Vitals:  Vitals:   05/03/21 1045 05/03/21 1100  BP: 116/89 114/78  Pulse: (!) 105 85  Resp: (!) 22 16  Temp:    SpO2: 100% 92%    Last Pain:  Vitals:   05/03/21 1100  TempSrc:   PainSc: 3                  Tymeshia Awan S

## 2021-05-03 NOTE — Interval H&P Note (Signed)
History and Physical Interval Note:  05/03/2021 9:19 AM  Scott Clark  has presented today for surgery, with the diagnosis of ABDOMINAL WALL INFECTED HEMATOMA.  The various methods of treatment have been discussed with the patient and family. After consideration of risks, benefits and other options for treatment, the patient has consented to  Procedure(s): INCISION AND DRAINAGE INFECTED HEMATOMA ABDOMINAL WALL (N/A) as a surgical intervention.  The patient's history has been reviewed, patient examined, no change in status, stable for surgery.  I have reviewed the patient's chart and labs.  Questions were answered to the patient's satisfaction.    He reports some spontaneous drainage from the abscess otherwise no change  Discussed surgical resident involvement Gaynelle Adu

## 2021-05-03 NOTE — Op Note (Signed)
05/03/2021  10:17 AM  PATIENT:  Scott Clark  40 y.o. 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  He was discharged home on the 21st and his recovery was unremarkable until he presented on October 8 complaining of discomfort and redness around one of his incision sites.  He underwent a CT scan which demonstrated a 6-1/2 x 4 and half centimeter subcutaneous soft tissue left lower abdominal wall organizing fluid collection.  There is no peripheral enhancement to suggest definitive abscess formation.  The on-call surgeon was consulted.  He underwent aspiration of the area in the ER and was sent out on Augmentin.  He followed up in the clinic a few days ago with still a fair amount of induration and cellulitis.  His aspiration had come back positive for strep.  Because he had ongoing significant induration as well as cellulitis of over 15 cm.  I did not think antibiotics alone would help him resolve the infection so therefore recommended returning to the operating room for incision and drainage.  We discussed the risk and benefits which are separately documented.  Since he was seen in the clinic on Wednesday he stated that the area had started to spontaneously drain some.  He described it as old blood.  PROCEDURE: After obtaining informed consent the patient was taken to the OR for at [facility].  He was placed supine on the operating room table.  Sequential compression devices were placed.  His abdomen was prepped and draped in the usual standard surgical fashion after general endotracheal anesthesia was established.  He received IV antibiotic prior to skin incision.  Surgical timeout was performed.  The area of induration was approximately 15 cm wide by about 8-1/2 cm vertical with corresponding cellulitis.  It was at the optical entry trocar site.  We made an elliptical incision incorporating the old trocar site with a scalpel and excised a section of skin and subcutaneous tissue measuring 2 cm vertical by 4 cm wide.  There is really not a lot of drainage of any fluid.  Cultures were placed down into the cavity.  There was a cavity that extended about 7 cm deep.  It did track a little bit to patient's left side.  There is no evidence of necrotic tissue.  The cavity was irrigated.  Hemostasis was achieved.  We packed the cavity with a small Kerlix followed by 4 x 4's and ABD pad.  The patient tolerated the procedure well.  There were no immediate complications.  He was extubated and taken to the recovery room in stable condition   I was personally present & scrubbed during the entire procedure and performed certain parts of the procedure, as documented in my operative note.  EBL: minimal  CASE DATA:  Type of patient?:  semi-elective [facility] private patient  Status of Case? URGENT Add On  Infection Present At Time Of Surgery (PATOS)?  No purulence/puss. Trace fluid that was  pinkish. No foul odor. No necrotic tissue. Cavity measuring 7 cm deep       PLAN OF CARE: Discharge to home after PACU  PATIENT DISPOSITION:  PACU - hemodynamically stable.   Delay start of Pharmacological VTE agent (>24hrs) due to surgical blood loss or risk of bleeding:  not applicable  Mary Sella. Andrey Campanile, MD, FACS General, Bariatric, & Minimally Invasive Surgery [facility],  [place]

## 2021-05-04 ENCOUNTER — Encounter (HOSPITAL_COMMUNITY): Payer: Self-pay | Admitting: General Surgery

## 2021-05-08 ENCOUNTER — Telehealth (HOSPITAL_COMMUNITY): Payer: Self-pay | Admitting: *Deleted

## 2021-05-08 LAB — AEROBIC/ANAEROBIC CULTURE W GRAM STAIN (SURGICAL/DEEP WOUND): Gram Stain: NONE SEEN

## 2021-06-04 ENCOUNTER — Other Ambulatory Visit: Payer: Self-pay

## 2021-06-04 ENCOUNTER — Encounter: Payer: PRIVATE HEALTH INSURANCE | Attending: General Surgery | Admitting: Skilled Nursing Facility1

## 2021-06-04 NOTE — Progress Notes (Signed)
Bariatric Nutrition Follow-Up Visit Medical Nutrition Therapy   NUTRITION ASSESSMENT      Surgery date: 04/08/2021 Surgery type: RYGB Start weight at [facility]: 547.9 pounds Weight today: 492.7 pounds  Clinical  Medical hx: sleep apnea, HTN Medications: multivitamin, elder berry, vitamin D, vitamin B12, C-PAP Labs:  Notable signs/symptoms:  Any previous deficiencies? vitamin D, vitamin b12   Lifestyle & Dietary Hx  Pt states he struggles with not having enough energy stating he feels he needs a nap.  Pt states his sleep is fine with about 5-8 hours per day. Pt states the struggle with getting in enough fluid is doing it.  Pt states he is just winging it with his meals and is not going to follow the phases.  Pt states he wants to start tracking his food intake. Pt states after the liquid diet he did not continue to follow the phases prescribed by the program. Pt states he does not need to because he is doing fine so far and happy with his weight loss.   Dietitian advised pt since he is not following the phases by the program the same results cannot be guaranteed.    Estimated daily fluid intake: 35-55 oz Estimated daily protein intake: 80+ g Supplements: multi and calcium  Current average weekly physical activity: walking 3-4 days a week for about 30-45 minutes but working on getting back to the gym  24-Hr Dietary Recall First Meal: eggs and whole wheat toast and skim milk Snack:  popcorn or beef jerky Second Meal: chicken and lettuce + cucumber  Snack:  popcorn or beef jerky or chicken Third Meal: fish and broccoli Snack: fish Beverages: diet gatorade, diet tea, diet juice, water  Post-Op Goals/ Signs/ Symptoms Using straws: no Drinking while eating: no Chewing/swallowing difficulties: no Changes in vision: no Changes to mood/headaches: no Hair loss/changes to skin/nails: no Difficulty focusing/concentrating: no Sweating: no Limb weakness: no Dizziness/lightheadedness:  no Palpitations: no  Carbonated/caffeinated beverages: no N/V/D/C/Gas: no Abdominal pain: no Dumping syndrome: no    NUTRITION DIAGNOSIS  Overweight/obesity ([place]-3.3) related to past poor dietary habits and physical inactivity as evidenced by completed bariatric surgery and following dietary guidelines for continued weight loss and healthy nutrition status.     NUTRITION INTERVENTION Nutrition counseling (C-1) and education (E-2) to facilitate bariatric surgery goals, including: The importance of consuming adequate calories as well as certain nutrients daily due to the body's need for essential vitamins, minerals, and fats The importance of daily physical activity and to reach a goal of at least 150 minutes of moderate to vigorous physical activity weekly (or as directed by their physician) due to benefits such as increased musculature and improved lab values The importance of intuitive eating specifically learning hunger-satiety cues and understanding the importance of learning a new body: The importance of mindful eating to avoid grazing behaviors    Learning Style & Readiness for Change Teaching method utilized: Visual & Auditory  Demonstrated degree of understanding via: Teach Back  Readiness Level: contemplative  Barriers to learning/adherence to lifestyle change: following his own plan

## 2021-08-24 ENCOUNTER — Other Ambulatory Visit (HOSPITAL_COMMUNITY): Payer: Self-pay

## 2021-09-18 ENCOUNTER — Emergency Department (HOSPITAL_COMMUNITY)
Admission: EM | Admit: 2021-09-18 | Discharge: 2021-09-18 | Disposition: A | Discharge status: Home / Self Care | Payer: PRIVATE HEALTH INSURANCE | Attending: Emergency Medicine | Admitting: Emergency Medicine

## 2021-09-18 ENCOUNTER — Other Ambulatory Visit: Payer: Self-pay

## 2021-09-18 ENCOUNTER — Emergency Department (HOSPITAL_COMMUNITY): Payer: PRIVATE HEALTH INSURANCE

## 2021-09-18 ENCOUNTER — Encounter (HOSPITAL_COMMUNITY): Payer: Self-pay

## 2021-09-18 DIAGNOSIS — N452 Orchitis: Secondary | ICD-10-CM | POA: Insufficient documentation

## 2021-09-18 DIAGNOSIS — N50811 Right testicular pain: Secondary | ICD-10-CM | POA: Diagnosis present

## 2021-09-18 DIAGNOSIS — N50819 Testicular pain, unspecified: Secondary | ICD-10-CM

## 2021-09-18 LAB — URINALYSIS, ROUTINE W REFLEX MICROSCOPIC
Bilirubin Urine: NEGATIVE
Glucose, UA: NEGATIVE mg/dL
Ketones, ur: NEGATIVE mg/dL
Nitrite: NEGATIVE
Protein, ur: NEGATIVE mg/dL
Specific Gravity, Urine: 1.009 (ref 1.005–1.030)
pH: 6 (ref 5.0–8.0)

## 2021-09-18 MED ORDER — HYDROMORPHONE HCL 1 MG/ML IJ SOLN
1.0000 mg | Freq: Once | INTRAMUSCULAR | Status: AC
  Administered 2021-09-18: 1 mg via INTRAMUSCULAR
  Filled 2021-09-18: qty 1

## 2021-09-18 MED ORDER — SULFAMETHOXAZOLE-TRIMETHOPRIM 800-160 MG PO TABS: 1.0000 | ORAL_TABLET | Freq: Two times a day (BID) | ORAL | 0 refills | Status: AC

## 2021-09-18 MED ORDER — NAPROXEN 500 MG PO TABS: 500.0000 mg | ORAL_TABLET | Freq: Two times a day (BID) | ORAL | 0 refills | Status: DC

## 2021-09-18 NOTE — Discharge Instructions (Addendum)
Please read the attached instructions regarding your illness.  I suspect that you have an early infection of your testicle.  The medicine for this is called Bactrim, take 1 tablet twice a day for 10 days.  I did discuss your case with Dr. Ronne Binning, our urologist who recommends this treatment.  I would like you to follow-up with the urologist in the office within 1 week if you are not getting any better.  I have given you their phone number. ? ?If you are developing worsening swelling pain vomiting or fever return to the emergency department immediately ?

## 2021-09-18 NOTE — ED Triage Notes (Signed)
Patient with complaints of right testicle pain for 2 days and had some red discharge after intercourse last night.  ?

## 2021-09-18 NOTE — ED Provider Notes (Signed)
?[facility] ?Provider Note ? ? ?CSN: [id number] ?Arrival date & time: 09/18/21  0746 ? ?  ? ?History ? ?Chief Complaint  ?Patient presents with  ? Testicle Pain  ? ? ?Scott Clark is a 41 y.o. male. ? ? ?Testicle Pain ? ? ?This patient is a 41 year old male, high body mass index.  Takes medications including Cardizem bupropion metoprolol and pantoprazole.  He presents to the hospital today with a complaint of right-sided testicular pain.  He reports that this started 2 days ago, he notes that it started after intercourse when he had some bloody discharge from his penis.  He has been with the same partner for 13 years and denies having any exposure to STDs.  He does not have any burning with urination or frequent urination, he has noted that the urine has been a little bit dark lately.  The pain has been constant for 2 days, it does wax and wane, it is particularly painful this morning.  He denies any prior history of testicular torsion, surgery, epididymitis or orchitis.  He is not having fevers and is not nauseated with this. ? ?Home Medications ?Prior to Admission medications   ?Medication Sig Start Date End Date Taking? Authorizing Provider  ?naproxen (NAPROSYN) 500 MG tablet Take 1 tablet (500 mg total) by mouth 2 (two) times daily with a meal. 09/18/21  Yes Eber Hong, MD  ?buPROPion (WELLBUTRIN) 100 MG tablet Take 1 tablet (100 mg total) by mouth 3 (three) times daily. 04/10/21 05/10/21  Gaynelle Adu, MD  ?diltiazem (CARDIZEM) 60 MG tablet Take 1 tablet (60 mg total) by mouth every 6 (six) hours. ?Patient taking differently: Take 60 mg by mouth 2 (two) times daily. 04/10/21 05/10/21  Gaynelle Adu, MD  ?metoprolol tartrate (LOPRESSOR) 100 MG tablet Take 1 tablet (100 mg total) by mouth 2 (two) times daily. 04/10/21 05/10/21  Gaynelle Adu, MD  ?pantoprazole (PROTONIX) 40 MG tablet Take 1 tablet (40 mg total) by mouth daily. 04/10/21   Gaynelle Adu, MD  ?sulfamethoxazole-trimethoprim (BACTRIM  DS) 800-160 MG tablet Take 1 tablet by mouth 2 (two) times daily for 10 days. 09/18/21 09/28/21 Yes Eber Hong, MD  ?   ? ?Allergies    ?Patient has no known allergies.   ? ?Review of Systems   ?Review of Systems  ?Genitourinary:  Positive for testicular pain.  ? ?Physical Exam ?Updated Vital Signs ?BP (!) 152/92   Pulse (!) 106   Temp 98.8 ?F (37.1 ?C) (Oral)   Resp 20   Ht 1.88 m (6\' 2" )   Wt (!) 223.2 kg   SpO2 100%   BMI 63.17 kg/m?  ?Physical Exam ?Vitals and nursing note reviewed.  ?Constitutional:   ?   Appearance: He is well-developed. He is not diaphoretic.  ?HENT:  ?   Head: Normocephalic and atraumatic.  ?Eyes:  ?   General:     ?   Right eye: No discharge.     ?   Left eye: No discharge.  ?   Conjunctiva/sclera: Conjunctivae normal.  ?Pulmonary:  ?   Effort: Pulmonary effort is normal. No respiratory distress.  ?Genitourinary: ?   Comments: Scrotum appears normal, left testicle is normal, right testicle is high riding and tender and hard to the touch.  There is no discharge from the penis, it is a normal-appearing penis and corona.  No bleeding at the urethral meatus.  There is no perigenital swelling redness or tenderness ?Skin: ?   General: Skin is warm  and dry.  ?   Findings: No erythema or rash.  ?Neurological:  ?   Mental Status: He is alert.  ?   Coordination: Coordination normal.  ? ? ?ED Results / Procedures / Treatments   ?Labs ?(all labs ordered are listed, but only abnormal results are displayed) ?Labs Reviewed  ?URINALYSIS, ROUTINE W REFLEX MICROSCOPIC - Abnormal; Notable for the following components:  ?    Result Value  ? APPearance HAZY (*)   ? Hgb urine dipstick SMALL (*)   ? Leukocytes,Ua MODERATE (*)   ? Bacteria, UA RARE (*)   ? All other components within normal limits  ?URINE CULTURE  ? ? ?EKG ?None ? ?Radiology ?US SCROTUM W/DOPPLER ? ?Result Date: 09/18/2021 ?CLINICAL DATA:  Testicular pain for 2 days. EXAM: SCROTAL ULTRASOUND DOPPLER ULTRASOUND OF THE TESTICLES TECHNIQUE:  Complete ultrasound examination of the testicles, epididymis, and other scrotal structures was performed. Color and spectral Doppler ultrasound were also utilized to evaluate blood flow to the testicles. COMPARISON:  Abdominopelvic CT 04/27/2021 FINDINGS: Right testicle Measurements: 3.4 x 2.6 x 2.8 cm. The right testis is diffusely heterogeneous in echotexture without focal mass lesion. Blood flow to the right testis appears subjectively increased, although there are limited images of both testes together. Left testicle Measurements: 4.2 x 2.3 x 3.3 cm. No mass or microlithiasis visualized. Normal blood flow with color Doppler. Right epididymis:  Normal in size and appearance. Left epididymis:  Normal in size and appearance. Hydrocele:  Moderate right and trace left hydroceles. Varicocele:  None visualized. Pulsed Doppler interrogation of both testes demonstrates normal low resistance arterial and venous waveforms bilaterally. Study is mildly limited by body habitus. IMPRESSION: 1. Abnormal appearance of the right testis with diffuse heterogeneity, relatively increased blood flow with color Doppler and a moderate size hydrocele. Findings are most consistent with orchitis. Differential includes spontaneous detorsion with reactive hyperemia. Recommend urology evaluation. 2. The left testis appears normal. Electronically Signed   By: Carey Bullocks M.D.   On: 09/18/2021 09:18   ? ?Procedures ?Procedures  ? ? ?Medications Ordered in ED ?Medications  ?HYDROmorphone (DILAUDID) injection 1 mg (1 mg Intramuscular Given 09/18/21 0852)  ? ? ?ED Course/ Medical Decision Making/ A&P ?  ?                        ?Medical Decision Making ?Amount and/or Complexity of Data Reviewed ?Labs: ordered. ?Radiology: ordered. ?ECG/medicine tests: ordered. ? ?Risk ?Prescription drug management. ? ? ?This patient presents to the ED for concern of testicular pain and swelling differential diagnosis includes testicular torsion, epididymitis or  orchitis ? ? ? ?Additional history obtained: ? ?Additional history obtained from electronic medical record ?External records from outside source obtained and reviewed including the patient had a gastric bypass surgery in October 2022 approximately 4 months ago.  No prior genitourinary complaints or problems or imaging. ? ? ?Lab Tests: ? ?I Ordered, and personally interpreted labs.  The pertinent results include: Urinalysis which shows 21-50 white blood cells with rare bacteria and no red blood cells. ? ? ?Imaging Studies ordered: ? ?I ordered imaging studies including ultrasound of the scrotum with Doppler flow to the testicles. ?I independently visualized and interpreted imaging which showed abnormal appearance of the right testicle, there does appear to be some heterogeneity but with increased blood flow and a moderate hydrocele, likely orchitis ?I agree with the radiologist interpretation ? ? ?Medicines ordered and prescription drug management: ? ?I ordered medication including  hydromorphone for pain ?Reevaluation of the patient after these medicines showed that the patient improved ?I have reviewed the patients home medicines and have made adjustments as needed ? ? ?Problem List / ED Course: ? ?Improved with pain medication ? ?Consultation: Discussed with Dr. Ronne Binning of the urology service who has looked at the ultrasound himself, we have discussed the case in detail and he recommends treating the patient with antibiotics for possible orchitis. ? ? ?Social Determinants of Health: ? ?None ? ?Home with Bactrim ? ? ? ? ? ? ? ? ? ? ?Final Clinical Impression(s) / ED Diagnoses ?Final diagnoses:  ?Orchitis  ? ? ?Rx / DC Orders ?ED Discharge Orders   ? ?      Ordered  ?  sulfamethoxazole-trimethoprim (BACTRIM DS) 800-160 MG tablet  2 times daily       ? 09/18/21 0949  ?  naproxen (NAPROSYN) 500 MG tablet  2 times daily with meals       ? 09/18/21 0950  ? ?  ?  ? ?  ? ? ?  ?Eber Hong, MD ?09/18/21 [phone] ? ?

## 2021-09-20 LAB — URINE CULTURE: Culture: 40000 — AB

## 2021-09-21 ENCOUNTER — Other Ambulatory Visit: Payer: Self-pay | Admitting: Cardiovascular Disease

## 2021-09-21 NOTE — Telephone Encounter (Signed)
Post ED Visit - Positive Culture Follow-up ? ?Culture report reviewed by antimicrobial stewardship pharmacist: ?[facility] ?[]  , Enzo Bi.D. ?[]  [address], .D., BCPS AQ-ID ?[]  Celedonio Miyamoto, Pharm.D., BCPS ?[]  [address], .D., BCPS ?[]  [place], .D., BCPS, AAHIVP ?[]  Georgina Pillion, Pharm.D., BCPS, AAHIVP ?[]  [address], PharmD, BCPS ?[]  , PharmD, BCPS ?[]  [place], PharmD, BCPS ?[]  [address], PharmD ?[]  , PharmD, BCPS ?[x]  Estella Husk, PharmD ? ? [facility] ?[]  Lysle Pearl, PharmD ?[]  , PharmD ?[]  Phillips Climes, PharmD ?[]  , Rph ?[]  Agapito Games) , PharmD ?[]  Verlan Friends, PharmD ?[]  , PharmD ?[]  Mervyn Gay, PharmD ?[]  , PharmD ?[]  Domingo Sep, PharmD ?[]  Gerri Spore, PharmD ?[]  , PharmD ?[]  Len Childs, PharmD ? ? ?Positive urine culture ?Treated with Sulfamethoxazole-Trimethoprim, organism sensitive to the same and no further patient follow-up is required at this time. ? ? ?09/21/2021, 11:54 AM ?  ?

## 2022-02-04 ENCOUNTER — Encounter: Payer: Self-pay | Admitting: *Deleted

## 2022-02-06 NOTE — Progress Notes (Signed)
Referring:  Avis Epley, PA-C [id number] [place],  [place] [address]  PCP: Assunta Found, MD  Neurology was asked to evaluate Scott Clark, a 41 year old male for a chief complaint of headaches.  Our recommendations of care will be communicated by shared medical record.    CC:  headaches  History provided from self  HPI:  Medical co-morbidities: afib, OSA, s/p gastric bypass  The patient presents for evaluation of headaches which began five years ago, but have become daily in the past month. He was prescribed a steroid taper for his headaches and they resolved. He has been headache free for the past 2 weeks. Typically he will develop 2-3 headaches per week for a month, then may go 1-2 months without headaches. Headaches are described as throbbing pain behind his right eye which radiates to his occiput. They are associated with photophobia, phonophobia, and nausea. Denies ipsilateral autonomic symptoms. He will need to lie down when headaches are severe. They typically last 1-3 hours at a time. He currently takes Tylenol for rescue which helps.  Has a history of afib which is currently rate-controlled with medication.  Headache History: Onset: 5 years ago Triggers: none Aura: no Location: right retro-orbital radiating to occiput Quality/Description: throbbing Associated Symptoms:  Photophobia: yes  Phonophobia: yes  Nausea: yes Worse with activity?: yes Duration of headaches: 1-3 hours, 30 minutes with medication  Headache days per month: 14 Headache free days per month: 16  Current Treatment: Abortive Tylenol  Preventative none  Prior Therapies                                 Metoprolol wellbutrin Prednisone taper - effective  LABS: CBC    Component Value Date/Time   WBC 6.3 05/02/2021 0958   RBC 4.28 05/02/2021 0958   HGB 11.7 (L) 05/02/2021 0958   HCT 37.5 (L) 05/02/2021 0958   PLT 210 05/02/2021 0958   MCV 87.6 05/02/2021 0958   MCH  27.3 05/02/2021 0958   MCHC 31.2 05/02/2021 0958   RDW 16.2 (H) 05/02/2021 0958   LYMPHSABS 1.5 04/27/2021 1426   MONOABS 0.7 04/27/2021 1426   EOSABS 0.1 04/27/2021 1426   BASOSABS 0.0 04/27/2021 1426      Latest Ref Rng & Units 05/02/2021    9:58 AM 04/27/2021    2:26 PM 04/09/2021    4:15 AM  CMP  Glucose 70 - 99 mg/dL 78  96  256   BUN 6 - 20 mg/dL 16  18  19    Creatinine 0.61 - 1.24 mg/dL  3.89  3.73   Sodium 135 - 145 mmol/L 140  139  140   Potassium 3.5 - 5.1 mmol/L 3.9  4.0  4.2   Chloride 98 - 111 mmol/L 105  105  108   CO2 22 - 32 mmol/L 24  25  26    Calcium 8.9 - 10.3 mg/dL 8.8  8.6  8.6   Total Protein 6.5 - 8.1 g/dL   7.0   Total Bilirubin 0.3 - 1.2 mg/dL   0.7   Alkaline Phos 38 - 126 U/L   50   AST 15 - 41 U/L   22   ALT 0 - 44 U/L   26      IMAGING:  none   Current Outpatient Medications on File Prior to Visit  Medication Sig Dispense Refill   diltiazem (CARDIZEM) 60 MG tablet  Take 1 tablet (60 mg total) by mouth every 6 (six) hours. (Patient taking differently: Take 60 mg by mouth 2 (two) times daily.) 120 tablet 1   metoprolol tartrate (LOPRESSOR) 100 MG tablet Take 1 tablet (100 mg total) by mouth 2 (two) times daily. 60 tablet 1   No current facility-administered medications on file prior to visit.     Allergies: No Known Allergies  Family History: Migraine or other headaches in the family:  no Aneurysms in a first degree relative:  no Brain tumors in the family:  no Other neurological illness in the family:   no  Past Medical History: Past Medical History:  Diagnosis Date   Abdominal obesity-metabolic syndrome type 3    Atrial fibrillation (HCC)    Bilateral edema of lower extremity    Normal ABI's (09/2015)   Hypertension    Migraine    Pneumonia    Sleep apnea     Past Surgical History Past Surgical History:  Procedure Laterality Date   GASTRIC ROUX-EN-Y N/A 04/08/2021   Procedure: LAPAROSCOPIC ROUX-EN-Y GASTRIC BYPASS WITH  UPPER ENDOSCOPY;  Surgeon: Gaynelle Adu, MD;  Location: [facility];  Service: General;  Laterality: N/A;   INCISION AND DRAINAGE ABSCESS N/A 05/03/2021   Procedure: INCISION AND DRAINAGE INFECTED HEMATOMA ABDOMINAL WALL;  Surgeon: Gaynelle Adu, MD;  Location: [facility];  Service: General;  Laterality: N/A;   KNEE SURGERY     UMBILICAL HERNIA REPAIR N/A 04/08/2021   Procedure: OPEN UMBILICAL HERNIA;  Surgeon: Gaynelle Adu, MD;  Location: [facility];  Service: General;  Laterality: N/A;   UPPER GI ENDOSCOPY N/A 04/08/2021   Procedure: UPPER GI ENDOSCOPY;  Surgeon: Gaynelle Adu, MD;  Location: [facility];  Service: General;  Laterality: N/A;    Social History: Social History   Tobacco Use   Smoking status: Some Days    Years: 12.00    Types: Cigarettes    Last attempt to quit: 12/19/2020    Years since quitting: 1.1   Smokeless tobacco: Never  Vaping Use   Vaping Use: Former   Substances: Nicotine, Flavoring  Substance Use Topics   Alcohol use: Yes    Comment: occ. use   Drug use: No    ROS: Negative for fevers, chills. Positive for headaches. All other systems reviewed and negative unless stated otherwise in HPI.   Physical Exam:   Vital Signs: BP 126/77   Pulse 62   Ht 6\' 2"  (1.88 m)   Wt (!) 444 lb 6.4 oz (201.6 kg)   BMI 57.06 kg/m  GENERAL: well appearing,in no acute distress,alert SKIN:  Color, texture, turgor normal. No rashes or lesions HEAD:  Normocephalic/atraumatic. CV:  RRR RESP: Normal respiratory effort MSK: no tenderness to palpation over occiput, neck, or shoulders  NEUROLOGICAL: Mental Status: Alert, oriented to person, place and time,Follows commands Cranial Nerves: PERRL, visual fields intact to confrontation, extraocular movements intact, facial sensation intact, no facial droop or ptosis, hearing grossly intact, no dysarthria Motor: muscle strength 5/5 both upper and lower extremities,no drift, normal tone Reflexes: 2+ throughout Sensation: intact to light touch  all 4 extremities Coordination: Finger-to- nose-finger intact bilaterally Gait: normal-based   IMPRESSION: 41 year old male with a history of afib, OSA, s/p gastric bypass who presents for evaluation of headaches. While his headache features have some overlap with cluster headaches, suspect he is having migraines as they are worsened with activity and do not have associated ipsilateral autonomic symptoms. Will start rizatriptan as needed for migraine headaches.  If ineffective could consider nasal Zomig or subQ Imitrex for rescue.  PLAN: -Start Maxalt 10 mg PRN -next steps: consider Zomig nasal spray or subQ Imitrex for rescue  I spent a total of 17 minutes chart reviewing and counseling the patient. Headache education was done. Discussed treatment options including acute medications. Discussed medication side effects, adverse reactions and drug interactions. Written educational materials and patient instructions outlining all of the above were given.  Follow-up: 6 months   Ocie Doyne, MD 02/07/2022   8:50 AM

## 2022-02-07 ENCOUNTER — Ambulatory Visit (INDEPENDENT_AMBULATORY_CARE_PROVIDER_SITE_OTHER): Payer: PRIVATE HEALTH INSURANCE | Admitting: Psychiatry

## 2022-02-07 ENCOUNTER — Encounter: Payer: Self-pay | Admitting: Psychiatry

## 2022-02-07 VITALS — BP 126/77 | HR 62 | Ht 74.0 in | Wt >= 6400 oz

## 2022-02-07 DIAGNOSIS — G43101 Migraine with aura, not intractable, with status migrainosus: Secondary | ICD-10-CM | POA: Diagnosis not present

## 2022-02-07 MED ORDER — RIZATRIPTAN BENZOATE 10 MG PO TABS: 10.0000 mg | ORAL_TABLET | ORAL | 6 refills | Status: AC | PRN

## 2022-02-07 NOTE — Patient Instructions (Signed)
Start rizatriptan 10 mg as needed for migriane. Take at the onset of migraine. If headache recurs or does not fully resolve, you may take a second dose after 2 hours. Please avoid taking more than 2 days per week to avoid rebound headaches

## 2022-03-29 IMAGING — CT CT ABD-PELV W/ CM
2 of 5 series · 16 of 46 positions shown, 18 images · IV contrast (omnipaque)
Comparison: None.

CLINICAL DATA: Abdominal abscess/infection suspected. Patient
reports that he had Bariatric surgery on 04/08/21. Patient states he
noticed increased pain and redness to the incision area x 4 days
ago. Patient denies any n/v or fever

EXAM:
CT ABDOMEN AND PELVIS WITH CONTRAST
TECHNIQUE: Multidetector CT imaging of the abdomen and pelvis was performed
using the standard protocol following bolus administration of
intravenous contrast.
CONTRAST:  80mL OMNIPAQUE IOHEXOL 350 MG/ML SOLN

[Series 2: axial st · axial · 0.98mm/px · z∈[-514,-94]mm · 13 of 98 slices shown, 15 images]
[im 7/98  soft-tissue]
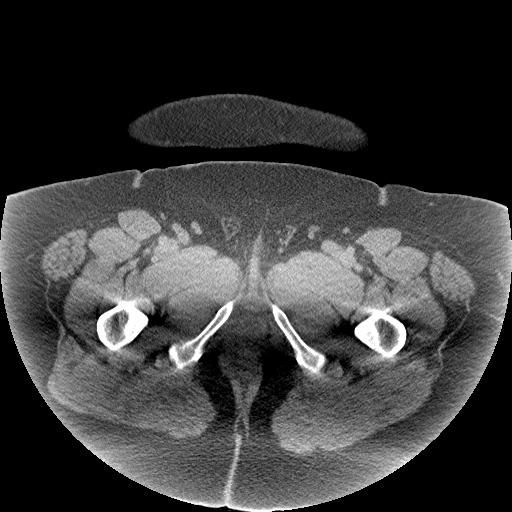
[im 7/98  bone]
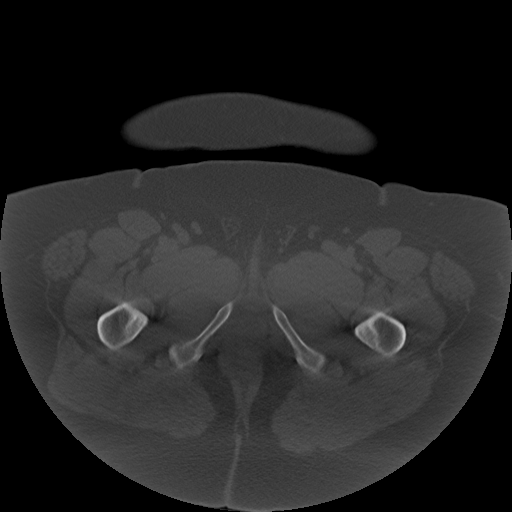
[im 14/98  soft-tissue]
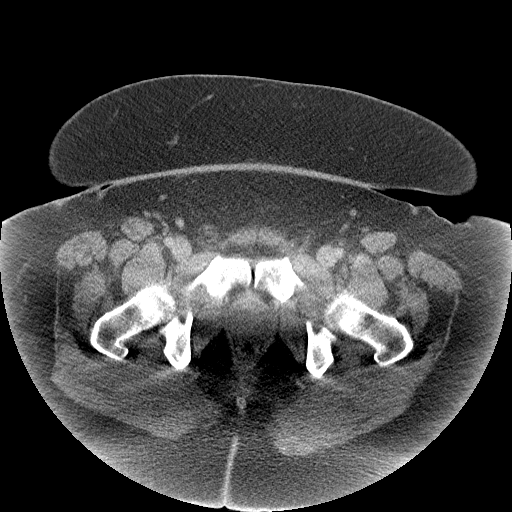
[im 21/98  soft-tissue]
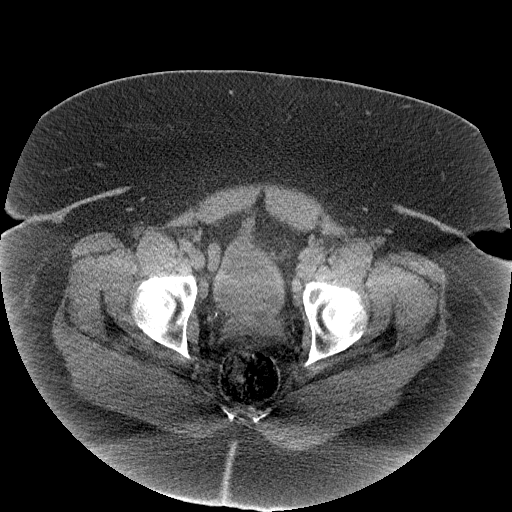
[im 28/98  soft-tissue]
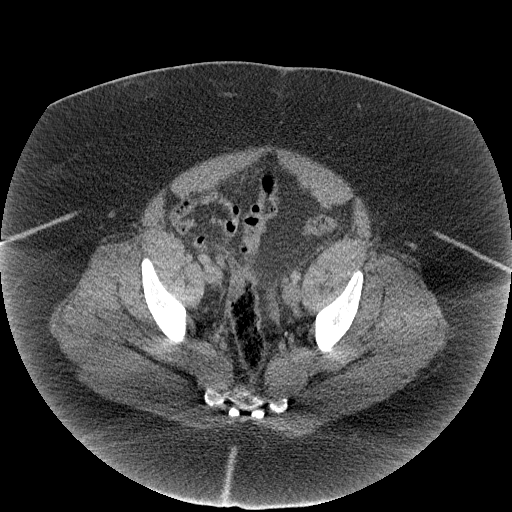
[im 35/98  soft-tissue]
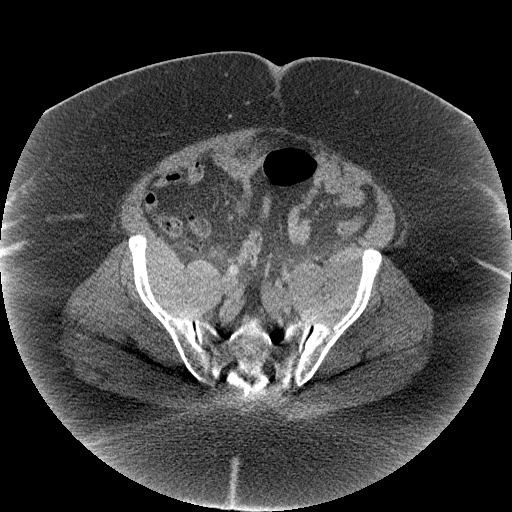
[im 42/98  soft-tissue]
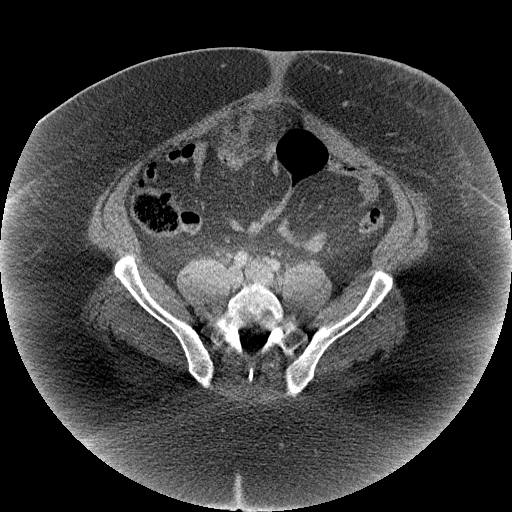
[im 49/98  soft-tissue]
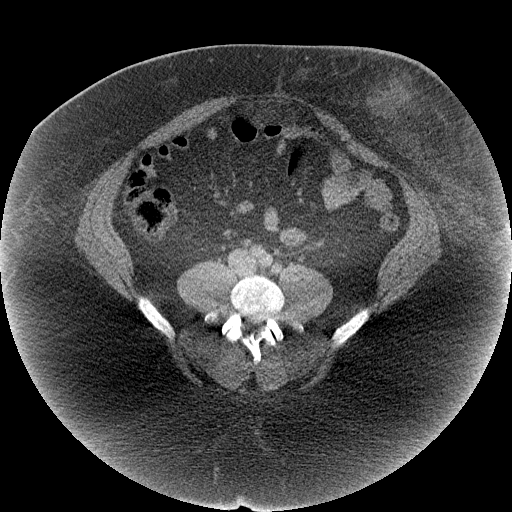
[im 56/98  soft-tissue]
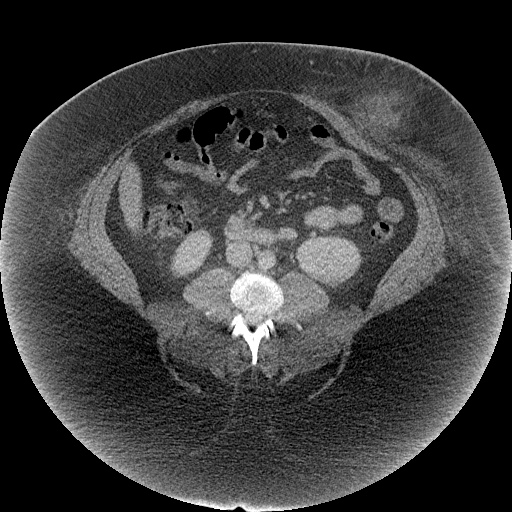
[im 63/98  soft-tissue]
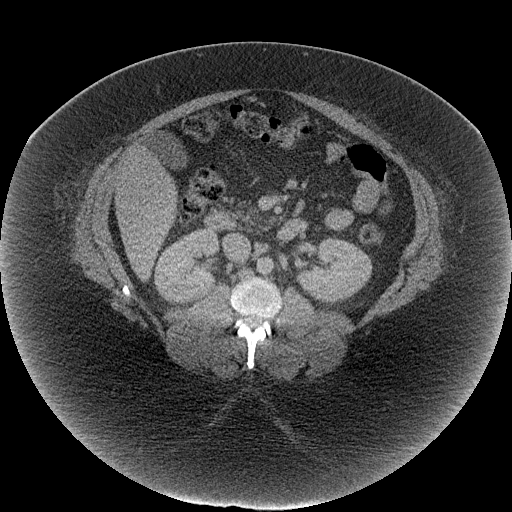
[im 63/98  bone]
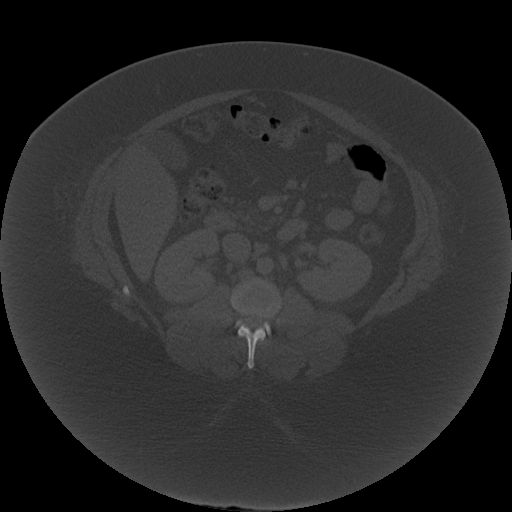
[im 70/98  soft-tissue]
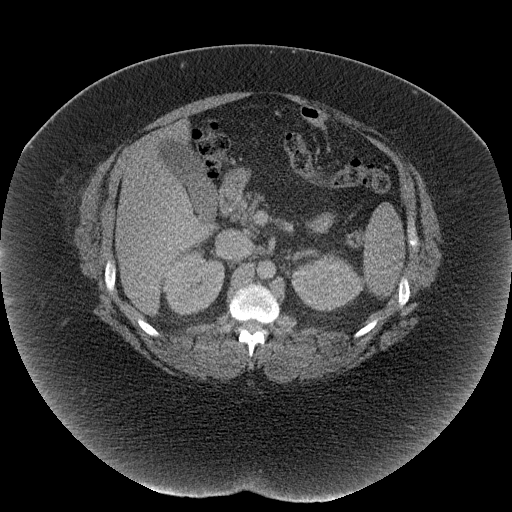
[im 77/98  soft-tissue]
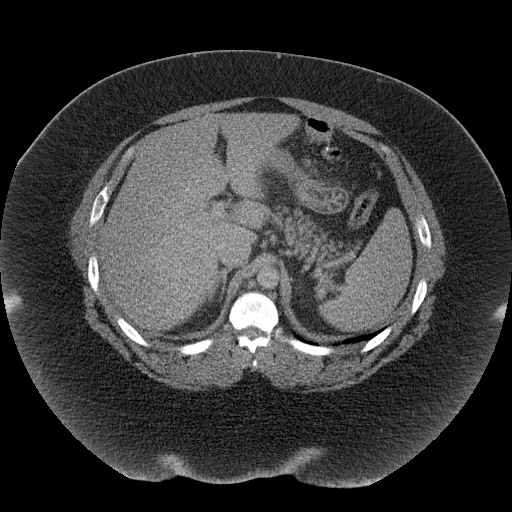
[im 84/98  soft-tissue]
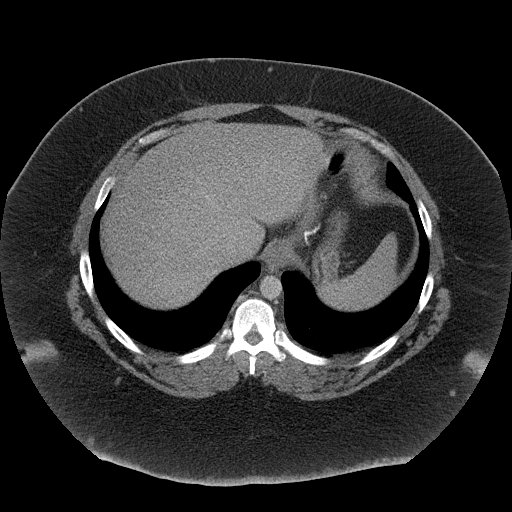
[im 91/98  soft-tissue]
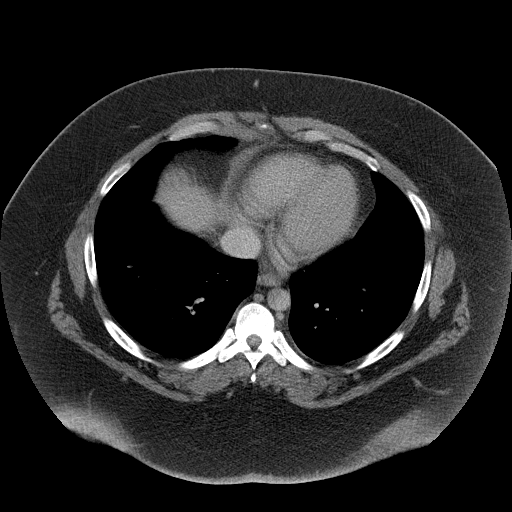

[Series 5: coronal st · coronal · 1.00mm/px · 3 of 232 slices shown]
[im 78/232  soft-tissue]
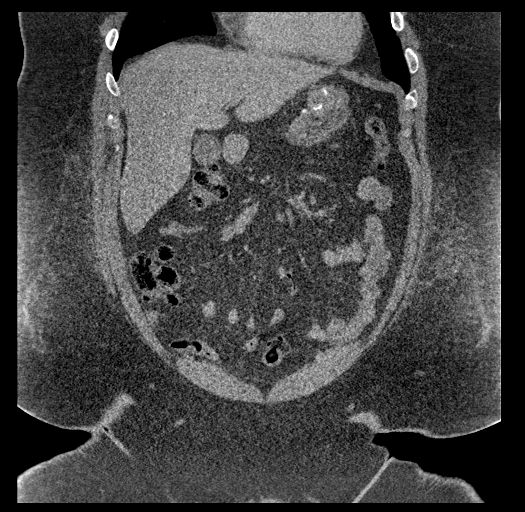
[im 103/232  soft-tissue]
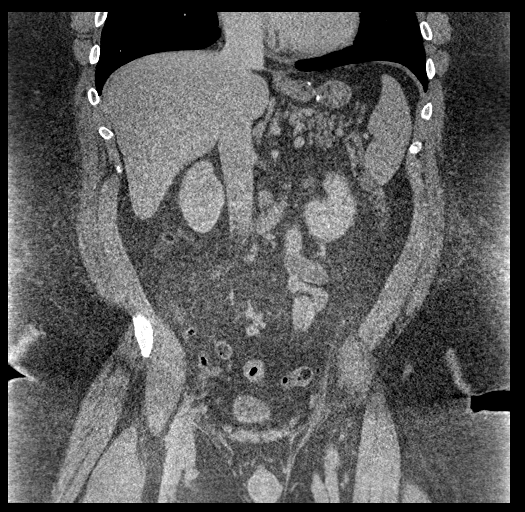
[im 129/232  soft-tissue]
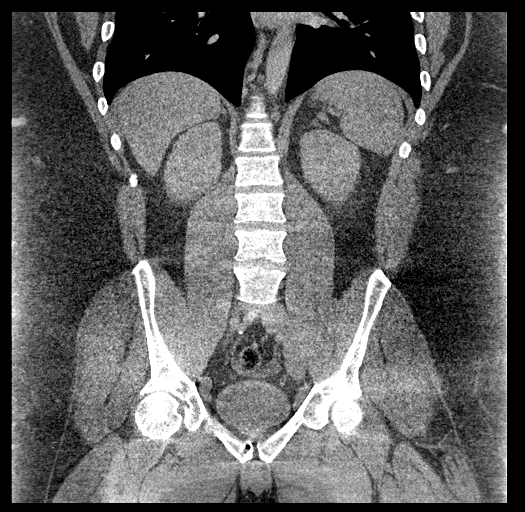

[16 of 46 positions shown; findings below may reference images not displayed]

FINDINGS: Lower chest: No acute abnormality.

Hepatobiliary: No focal liver abnormality. No gallstones,
gallbladder wall thickening, or pericholecystic fluid. No biliary
dilatation.

Pancreas: No focal lesion. Normal pancreatic contour. No surrounding
inflammatory changes. No main pancreatic ductal dilatation.

Spleen: Normal in size without focal abnormality.

Adrenals/Urinary Tract:

No adrenal nodule bilaterally.

Bilateral kidneys enhance symmetrically.

No hydronephrosis. No hydroureter.

The urinary bladder is unremarkable.

Stomach/Bowel: Status post Roux-en-Y gastric bypass. Stomach is
within normal limits. No evidence of bowel wall thickening or
dilatation. Appendix appears normal.

Vascular/Lymphatic: No abdominal aorta or iliac aneurysm. Borderline
enlarged 1.1 cm right common iliac lymph node. No abdominal, pelvic,
or inguinal lymphadenopathy.

Reproductive: Prostate is unremarkable.

Other: Question mild fat stranding of the mesentery just inferior to
the umbilicus. No intraperitoneal free fluid. No intraperitoneal
free gas. No organized fluid collection.

Musculoskeletal:

Interval development of a subcutaneus soft tissue left lower
abdominal organizing fluid collection measuring 6.5 cm by 4.5 cm
with no associated thick walls or peripheral enhancement to suggest
definite abscess formation. Associated surrounding subcutaneus soft
tissue edema.

Pericentimeter soft tissue densities within the right anterior
abdominal soft tissues likely related to injection of medication.

Mild soft tissue edema surrounding the umbilicus.

No suspicious lytic or blastic osseous lesions. No acute displaced
fracture. Multilevel degenerative changes of the spine.
IMPRESSION: 1. Interval development of a 6.5 x 4.5 cm left lower abdominal
subcutaneus soft tissues fluid collection. No definite thick walls
or peripheral enhancement to suggest abscess formation. Finding may
represent a seroma versus liquefied hematoma. Associated surrounding
subcutaneus soft tissue edema. Finding may be related to medication
injection sites.
2. Mild soft tissue edema surrounding the umbilicus with question of
mild fat stranding of the mesentery just inferior to the umbilicus.
Markedly limited evaluation due to quantum mottle artifact. No
intra-abdominal organized fluid collection/abscess formation. No
pneumoperitoneum. Finding may be related to fat necrosis in the
setting of postsurgical changes versus developing infection.
Correlate with physical exam.
3. Nonspecific borderline enlarged 1.1 cm right common iliac lymph
node.

## 2022-07-29 ENCOUNTER — Other Ambulatory Visit (HOSPITAL_COMMUNITY): Payer: Self-pay

## 2022-07-29 ENCOUNTER — Ambulatory Visit: Payer: PRIVATE HEALTH INSURANCE | Admitting: Adult Health

## 2022-09-03 ENCOUNTER — Ambulatory Visit (INDEPENDENT_AMBULATORY_CARE_PROVIDER_SITE_OTHER): Payer: PRIVATE HEALTH INSURANCE | Admitting: Podiatry

## 2022-09-03 ENCOUNTER — Encounter: Payer: Self-pay | Admitting: Podiatry

## 2022-09-03 DIAGNOSIS — L600 Ingrowing nail: Secondary | ICD-10-CM

## 2022-09-03 MED ORDER — NEOMYCIN-POLYMYXIN-HC 1 % OT SOLN: OTIC | 1 refills | Status: DC

## 2022-09-03 NOTE — Progress Notes (Signed)
Subjective:  Patient ID: Scott Clark, male    DOB: 24-May-1981,  MRN: [id number] HPI Chief Complaint  Patient presents with   Toe Pain    Hallux left - both borders, ingrown intermittent x years, tried cutting it out multiple times    New Patient (Initial Visit)    42 y.o. male presents with the above complaint.   ROS: Denies fever chills nausea vomit muscle aches pains calf pain back pain chest pain shortness of breath.  Past Medical History:  Diagnosis Date   Abdominal obesity-metabolic syndrome type 3    Atrial fibrillation (HCC)    Bilateral edema of lower extremity    Normal ABI's (09/2015)   Hypertension    Migraine    Pneumonia    Sleep apnea    Past Surgical History:  Procedure Laterality Date   GASTRIC ROUX-EN-Y N/A 04/08/2021   Procedure: LAPAROSCOPIC ROUX-EN-Y GASTRIC BYPASS WITH UPPER ENDOSCOPY;  Surgeon: Greer Pickerel, MD;  Location: [facility];  Service: General;  Laterality: N/A;   INCISION AND DRAINAGE ABSCESS N/A 05/03/2021   Procedure: INCISION AND DRAINAGE INFECTED HEMATOMA ABDOMINAL WALL;  Surgeon: Greer Pickerel, MD;  Location: [facility];  Service: General;  Laterality: N/A;   KNEE SURGERY     UMBILICAL HERNIA REPAIR N/A 04/08/2021   Procedure: OPEN UMBILICAL HERNIA;  Surgeon: Greer Pickerel, MD;  Location: [facility];  Service: General;  Laterality: N/A;   UPPER GI ENDOSCOPY N/A 04/08/2021   Procedure: UPPER GI ENDOSCOPY;  Surgeon: Greer Pickerel, MD;  Location: [facility];  Service: General;  Laterality: N/A;    Current Outpatient Medications:    buPROPion (WELLBUTRIN XL) 150 MG 24 hr tablet, Take 150 mg by mouth daily., Disp: , Rfl:    metoprolol succinate (TOPROL-XL) 25 MG 24 hr tablet, Take 25 mg by mouth daily., Disp: , Rfl:    XARELTO 20 MG TABS tablet, Take 20 mg by mouth daily., Disp: , Rfl:    diltiazem (CARDIZEM) 60 MG tablet, Take 1 tablet (60 mg total) by mouth every 6 (six) hours. (Patient taking differently: Take 60 mg by mouth 2 (two) times daily.), Disp: 120  tablet, Rfl: 1   NEOMYCIN-POLYMYXIN-HYDROCORTISONE (CORTISPORIN) 1 % SOLN OTIC solution, Apply 1-2 drops to toe BID after soaking, Disp: 10 mL, Rfl: 1   rizatriptan (MAXALT) 10 MG tablet, Take 1 tablet (10 mg total) by mouth as needed for migraine. May repeat in 2 hours if needed, Disp: 10 tablet, Rfl: 6  No Known Allergies Review of Systems Objective:  There were no vitals filed for this visit.  General: Well developed, nourished, in no acute distress, alert and oriented x3   Dermatological: Skin is warm, dry and supple bilateral. Nails x 10 are well maintained; remaining integument appears unremarkable at this time. There are no open sores, no preulcerative lesions, no rash or signs of infection present.  Sharply abraded nail margin along the tibial border of the hallux left  Vascular: Dorsalis Pedis artery and Posterior Tibial artery pedal pulses are 2/4 bilateral with immedate capillary fill time. Pedal hair growth present. No varicosities and no lower extremity edema present bilateral.   Neruologic: Grossly intact via light touch bilateral. Vibratory intact via tuning fork bilateral. Protective threshold with Semmes Wienstein monofilament intact to all pedal sites bilateral. Patellar and Achilles deep tendon reflexes 2+ bilateral. No Babinski or clonus noted bilateral.   Musculoskeletal: No gross boney pedal deformities bilateral. No pain, crepitus, or limitation noted with foot and ankle range of motion bilateral. Muscular  strength 5/5 in all groups tested bilateral.  Gait: Unassisted, Nonantalgic.    Radiographs:  None taken  Assessment & Plan:   Assessment: Ingrown toenail tibial border hallux left  Plan: Chemical matricectomy performed today tolerated procedure well without complications after local anesthetic was administered.  He was given both Augmentin 1/recurrent second toe as well as prescription for Cortisporin Otic to be applied twice daily after soaking.  Follow-up  with him in 2 weeks     Jadore Veals T. [place], [place]

## 2022-09-03 NOTE — Patient Instructions (Signed)

## 2022-09-11 ENCOUNTER — Encounter: Payer: Self-pay | Admitting: Internal Medicine

## 2022-09-11 ENCOUNTER — Ambulatory Visit: Payer: PRIVATE HEALTH INSURANCE | Attending: Internal Medicine | Admitting: Internal Medicine

## 2022-09-11 VITALS — BP 140/92 | HR 92 | Ht 74.0 in | Wt >= 6400 oz

## 2022-09-11 DIAGNOSIS — I4891 Unspecified atrial fibrillation: Secondary | ICD-10-CM | POA: Diagnosis not present

## 2022-09-11 MED ORDER — METOPROLOL TARTRATE 25 MG PO TABS: 25.0000 mg | ORAL_TABLET | Freq: Two times a day (BID) | ORAL | 3 refills | Status: DC

## 2022-09-11 NOTE — Patient Instructions (Signed)
Medication Instructions:  Your physician has recommended you make the following change in your medication:  - Stop Metoprolol Succinate - Start Metoprolol Tartrate 25 mg tablets twice daily  *If you need a refill on your cardiac medications before your next appointment, please call your pharmacy*   Lab Work: None If you have labs (blood work) drawn today and your tests are completely normal, you will receive your results only by: [place] (if you have MyChart) OR A paper copy in the mail If you have any lab test that is abnormal or we need to change your treatment, we will call you to review the results.   Testing/Procedures: None   Follow-Up: At [facility], you and your health needs are our priority.  As part of our continuing mission to provide you with exceptional heart care, we have created designated Provider Care Teams.  These Care Teams include your primary Cardiologist (physician) and Advanced Practice Providers (APPs -  Physician Assistants and Nurse Practitioners) who all work together to provide you with the care you need, when you need it.  We recommend signing up for the patient portal called "MyChart".  Sign up information is provided on this After Visit Summary.  MyChart is used to connect with patients for Virtual Visits (Telemedicine).  Patients are able to view lab/test results, encounter notes, upcoming appointments, etc.  Non-urgent messages can be sent to your provider as well.   To learn more about what you can do with MyChart, go to [email or web address].    Your next appointment:   6 month(s)  Provider:   Claudina Lick, MD    Other Instructions Adult Weight Management Program

## 2022-09-11 NOTE — Progress Notes (Signed)
Cardiology Office Note  Date: 09/11/2022   ID: Sonia Hackl, DOB 06-Jan-1981, MRN [id number]  PCP:  Sharilyn Sites, MD  Cardiologist:  None Electrophysiologist:  None   Reason for Office Visit: Follow-up of persistent atrial fibrillation   History of Present Illness: Scott Clark is a 42 y.o. male known to have persistent A-fib (diagnosed in 2019, initially paroxysmal but since 2021 has been persistent) not on AC, OSA on CPAP, nicotine abuse presented to cardiology clinic for follow-up visit.  Patient was recently started on Xarelto 20 mg once daily by his PCP (CHA2DS2-VASc score is 1). He denies any symptoms of palpitations, chest pain, SOB, dizziness, lightheadedness, syncope, leg swelling and claudication. He used to smoke 1 pack/day but currently cut down to occasional social smoking. He wears CPAP every day at night religiously. He underwent gastric bypass surgery in 2022 and lost almost 100 pounds (he was 540 pounds in 2022, currently 425 pounds).  Past Medical History:  Diagnosis Date   Abdominal obesity-metabolic syndrome type 3    Atrial fibrillation (HCC)    Bilateral edema of lower extremity    Normal ABI's (09/2015)   Hypertension    Migraine    Pneumonia    Sleep apnea     Past Surgical History:  Procedure Laterality Date   GASTRIC ROUX-EN-Y N/A 04/08/2021   Procedure: LAPAROSCOPIC ROUX-EN-Y GASTRIC BYPASS WITH UPPER ENDOSCOPY;  Surgeon: Greer Pickerel, MD;  Location: [facility];  Service: General;  Laterality: N/A;   INCISION AND DRAINAGE ABSCESS N/A 05/03/2021   Procedure: INCISION AND DRAINAGE INFECTED HEMATOMA ABDOMINAL WALL;  Surgeon: Greer Pickerel, MD;  Location: [facility];  Service: General;  Laterality: N/A;   KNEE SURGERY     UMBILICAL HERNIA REPAIR N/A 04/08/2021   Procedure: OPEN UMBILICAL HERNIA;  Surgeon: Greer Pickerel, MD;  Location: [facility];  Service: General;  Laterality: N/A;   UPPER GI ENDOSCOPY N/A 04/08/2021   Procedure: UPPER GI ENDOSCOPY;  Surgeon:  Greer Pickerel, MD;  Location: [facility];  Service: General;  Laterality: N/A;    Current Outpatient Medications  Medication Sig Dispense Refill   buPROPion (WELLBUTRIN XL) 150 MG 24 hr tablet Take 150 mg by mouth daily.     diltiazem (CARDIZEM) 60 MG tablet Take 1 tablet (60 mg total) by mouth every 6 (six) hours. (Patient taking differently: Take 60 mg by mouth 2 (two) times daily.) 120 tablet 1   metoprolol tartrate (LOPRESSOR) 25 MG tablet Take 1 tablet (25 mg total) by mouth 2 (two) times daily. 180 tablet 3   NEOMYCIN-POLYMYXIN-HYDROCORTISONE (CORTISPORIN) 1 % SOLN OTIC solution Apply 1-2 drops to toe BID after soaking 10 mL 1   XARELTO 20 MG TABS tablet Take 20 mg by mouth daily.     rizatriptan (MAXALT) 10 MG tablet Take 1 tablet (10 mg total) by mouth as needed for migraine. May repeat in 2 hours if needed (Patient not taking: Reported on 09/11/2022) 10 tablet 6   No current facility-administered medications for this visit.   Allergies:  Patient has no known allergies.   Social History: The patient  reports that he has been smoking cigarettes. He has never used smokeless tobacco. He reports current alcohol use. He reports that he does not use drugs.   Family History: The patient's family history includes Hypertension in his father.   ROS:  Please see the history of present illness. Otherwise, complete review of systems is positive for none.  All other systems are reviewed and negative.   Physical  Exam: VS:  BP (!) 140/92   Pulse 92   Ht 6' 2"$  (1.88 m)   Wt (!) 425 lb 6.4 oz (193 kg)   SpO2 100%   BMI 54.62 kg/m , BMI Body mass index is 54.62 kg/m.  Wt Readings from Last 3 Encounters:  09/11/22 (!) 425 lb 6.4 oz (193 kg)  02/07/22 (!) 444 lb 6.4 oz (201.6 kg)  09/18/21 (!) 492 lb (223.2 kg)    General: Patient appears comfortable at rest. HEENT: Conjunctiva and lids normal, oropharynx clear with moist mucosa. Neck: Supple, no elevated JVP or carotid bruits, no  thyromegaly. Lungs: Clear to auscultation, nonlabored breathing at rest. Cardiac: Regular rate and rhythm, no S3 or significant systolic murmur, no pericardial rub. Abdomen: Soft, nontender, no hepatomegaly, bowel sounds present, no guarding or rebound. Extremities: No pitting edema, distal pulses 2+. Skin: Warm and dry. Musculoskeletal: No kyphosis. Neuropsychiatric: Alert and oriented x3, affect grossly appropriate.  ECG: Atrial fibrillation today, HR 92 bpm  Recent Labwork: No results found for requested labs within last 365 days.     Component Value Date/Time   CHOL 152 12/17/2017 0518   TRIG 85 12/17/2017 0518   HDL 34 (L) 12/17/2017 0518   CHOLHDL 4.5 12/17/2017 0518   VLDL 17 12/17/2017 0518   LDLCALC 101 (H) 12/17/2017 0518    Other Studies Reviewed Today: Echocardiogram from 2022 LVEF normal No valve abnormalities  Assessment and Plan: Patient is a 42 year old M known to have persistent A-fib (diagnosed in 2019, initially paroxysmal but since 2021 has been persistent) not on AC, HTN, OSA on CPAP, nicotine abuse, gastric bypass surgery for weight loss presented to cardiology clinic for follow-up visit.  # Longstanding persistent atrial fibrillation, CV score is 1 (EKG today showed atrial fibrillation, HR 92 bpm) -Patient was diagnosed with atrial fibrillation in 2019, initially paroxysmal but since 04/2020, he has been in persistent atrial fibrillation.  He is not a candidate for rhythm control strategies at this point due to asymptomatic nature of A-fib, more than 1 year of longstanding persistent atrial fibrillation and morbid obesity. -Due to gastric bypass surgery he will benefit from short acting medications. Continue diltiazem 60 mg every 12 hours and switch metoprolol succinate to tartrate 25 mg twice daily. -CHA2DS2-VASc 2 score is 1 (HTN).  Due to persistent nature of atrial fibrillation, I will continue his AC with Xarelto 20 mg with meals for stroke prophylaxis.   No risk of falls.  # Morbid obesity -Patient underwent gastric bypass surgery in 2022 after which he lost more than 100 pounds (540 pounds in 2022, currently 425 pounds). I explained to the patient that weight loss will help in preventing atrial fibrillation with RVR recurrences. Diet and exercise counseling including 500 -calorie deficit per day, 30 minutes of exercise per day to a total amount of 150 minutes of exercise in a week. he voiced understanding. Ambulatory referral to adult weight management program. If these measures are not working for him, he will benefit from [facility].  # OSA on CPAP -Compliant with CPAP therapy  # Nicotine abuse -Patient has cut down cigarettes from 1 pack/day to occasional social smoking now.  He is motivated to quit completely.  Smoking cessation counseling provided. Smoking cessation instruction/counseling given:  counseled patient on the dangers of tobacco use, advised patient to stop smoking, and reviewed strategies to maximize success.   I have spent a total of 33 minutes with patient reviewing chart ,EKGs, labs and examining patient as well as  establishing an assessment and plan that was discussed with the patient.  > 50% of time was spent in direct patient care.     Medication Adjustments/Labs and Tests Ordered: Current medicines are reviewed at length with the patient today.  Concerns regarding medicines are outlined above.   Tests Ordered: Orders Placed This Encounter  Procedures   Amb ref to Medical Nutrition Therapy-MNT   EKG 12-Lead    Medication Changes: Meds ordered this encounter  Medications   metoprolol tartrate (LOPRESSOR) 25 MG tablet    Sig: Take 1 tablet (25 mg total) by mouth 2 (two) times daily.    Dispense:  180 tablet    Refill:  3    Disposition:  Follow up  6 months  Signed, Johara Lodwick Fidel Levy, MD, 09/11/2022 9:00 AM    [facility] [address], [place], [place] [address]

## 2022-09-23 ENCOUNTER — Ambulatory Visit: Payer: PRIVATE HEALTH INSURANCE | Admitting: Podiatry

## 2022-11-06 ENCOUNTER — Encounter (HOSPITAL_COMMUNITY): Payer: Self-pay | Admitting: *Deleted

## 2022-11-13 ENCOUNTER — Ambulatory Visit: Payer: PRIVATE HEALTH INSURANCE | Admitting: Nutrition

## 2023-03-10 ENCOUNTER — Other Ambulatory Visit: Payer: Self-pay

## 2023-03-24 ENCOUNTER — Encounter: Payer: PRIVATE HEALTH INSURANCE | Admitting: Internal Medicine

## 2023-03-24 NOTE — Progress Notes (Signed)
Erroneous encounter - please disregard.

## 2023-10-21 ENCOUNTER — Other Ambulatory Visit: Payer: Self-pay | Admitting: Internal Medicine

## 2023-10-30 ENCOUNTER — Encounter (HOSPITAL_COMMUNITY): Payer: Self-pay | Admitting: *Deleted

## 2023-12-29 ENCOUNTER — Ambulatory Visit: Payer: Self-pay | Admitting: Student

## 2024-01-05 ENCOUNTER — Institutional Professional Consult (permissible substitution): Payer: Self-pay | Admitting: Family Medicine

## 2024-01-29 ENCOUNTER — Ambulatory Visit: Payer: Self-pay | Attending: Student | Admitting: Student

## 2024-01-29 ENCOUNTER — Encounter: Payer: Self-pay | Admitting: Student

## 2024-01-29 VITALS — BP 122/76 | HR 86 | Ht 74.0 in | Wt 361.0 lb

## 2024-01-29 DIAGNOSIS — G4733 Obstructive sleep apnea (adult) (pediatric): Secondary | ICD-10-CM | POA: Diagnosis not present

## 2024-01-29 DIAGNOSIS — Z720 Tobacco use: Secondary | ICD-10-CM

## 2024-01-29 DIAGNOSIS — Z7901 Long term (current) use of anticoagulants: Secondary | ICD-10-CM | POA: Diagnosis not present

## 2024-01-29 DIAGNOSIS — I4891 Unspecified atrial fibrillation: Secondary | ICD-10-CM | POA: Diagnosis not present

## 2024-01-29 MED ORDER — DILTIAZEM HCL 90 MG PO TABS: 90.0000 mg | ORAL_TABLET | Freq: Two times a day (BID) | ORAL | 3 refills | Status: AC

## 2024-01-29 MED ORDER — XARELTO 20 MG PO TABS: 20.0000 mg | ORAL_TABLET | Freq: Every day | ORAL | 3 refills | Status: AC

## 2024-01-29 NOTE — Patient Instructions (Signed)
 Medication Instructions:  Stop taking Diltiazem  60 mg  Start Taking Diltiazem  90 mg Two Times Daily   *If you need a refill on your cardiac medications before your next appointment, please call your pharmacy*  Lab Work: NONE   If you have labs (blood work) drawn today and your tests are completely normal, you will receive your results only by: MyChart Message (if you have MyChart) OR A paper copy in the mail If you have any lab test that is abnormal or we need to change your treatment, we will call you to review the results.  Testing/Procedures: NONE   Follow-Up: At [facility], you and your health needs are our priority.  As part of our continuing mission to provide you with exceptional heart care, our providers are all part of one team.  This team includes your primary Cardiologist (physician) and Advanced Practice Providers or APPs (Physician Assistants and Nurse Practitioners) who all work together to provide you with the care you need, when you need it.  Your next appointment:   1 year(s)  Provider:   You may see Vishnu P Mallipeddi, MD or one of the following Advanced Practice Providers on your designated Care Team:   [place], PA-C  [place], [place] Olivia [place], [place]     We recommend signing up for the patient portal called MyChart.  Sign up information is provided on this After Visit Summary.  MyChart is used to connect with patients for Virtual Visits (Telemedicine).  Patients are able to view lab/test results, encounter notes, upcoming appointments, etc.  Non-urgent messages can be sent to your provider as well.   To learn more about what you can do with MyChart, go to [email or web address].   Other Instructions Thank you for choosing [facility]!

## 2024-01-29 NOTE — Progress Notes (Signed)
 Cardiology Office Note    Date:  01/29/2024  ID:  Keagon Glascoe, DOB Jan 19, 1981, MRN [id number] Cardiologist: Diannah SHAUNNA Maywood, MD    History of Present Illness:    Scott Clark is a 43 y.o. male with past medical history of persistent atrial fibrillation, OSA, history of gastric bypass and tobacco use who presents to the office today for overdue follow-up.  He was last examined by Dr. Maywood in 08/2022 and denied any recent chest pain or palpitations at that time. He was using his CPAP on a nightly basis. While his CHA2DS2-VASc score was only 1, he had been started on anticoagulation by his PCP and this was continued with Xarelto  20 mg daily. Given prior gastric bypass surgery, it was felt he would benefit from short-acting medications and he was transitioned to short-acting Cardizem  60 mg QID (listed in notes as BID) and Toprol -XL was transitioned to Lopressor  25 mg twice daily. Was informed to follow-up in 6 months but has not been evaluated by Cardiology since.  In talking with the patient today, he reports overall doing well since his last office visit. He experiences occasional palpitations but no persistent or very bothersome symptoms. No shortness of breath when walking on flat surfaces but does have dyspnea with inclines which is not new. No recent exertional chest pain, orthopnea or PND. Uses his CPAP on a nightly basis. He does experience lower extremity edema and wears compression stockings daily. He has lost an additional 60+ pounds since his last office visit in the setting of dietary changes and increased activity as his job is now more physically demanding. Remains on Xarelto  for anticoagulation with no reports of active bleeding. Does report difficulty with taking Cardizem  as it is prescribed currently as 4 times daily dosing. He did quit taking Lopressor  approximately 6 months ago as he felt like these were duplicate medications.  Studies Reviewed:   EKG: EKG is ordered  today and demonstrates:   EKG Interpretation Date/Time:  Friday January 29 2024 14:55:23 EDT Ventricular Rate:  86 PR Interval:    QRS Duration:  98 QT Interval:  348 QTC Calculation: 416 R Axis:   96  Text Interpretation: Atrial fibrillation Rightward axis Nonspecific T wave abnormality Confirmed by Johnson Grate ([id number]) on 01/29/2024 3:09:47 PM       Echocardiogram: 08/2020 IMPRESSIONS     1. Left ventricular ejection fraction, by estimation, is 60 to 65%. The  left ventricle has normal function. The left ventricle has no regional  wall motion abnormalities. There is mild concentric left ventricular  hypertrophy. Left ventricular diastolic  function could not be evaluated.   2. Right ventricular systolic function is normal. The right ventricular  size is normal. Tricuspid regurgitation signal is inadequate for assessing  PA pressure.   3. The mitral valve is normal in structure. No evidence of mitral valve  regurgitation. No evidence of mitral stenosis.   4. The aortic valve is normal in structure. Aortic valve regurgitation is  not visualized. No aortic stenosis is present.   5. The inferior vena cava is normal in size with greater than 50%  respiratory variability, suggesting right atrial pressure of 3 mmHg.   Risk Assessment/Calculations:    CHA2DS2-VASc Score = 1   This indicates a 0.6% annual risk of stroke. The patient's score is based upon: CHF History: 0 HTN History: 1 Diabetes History: 0 Stroke History: 0 Vascular Disease History: 0 Age Score: 0 Gender Score: 0     Physical Exam:  VS:  BP 122/76   Pulse 86   Ht 6' 2 (1.88 m)   Wt (!) 361 lb (163.7 kg)   SpO2 96%   BMI 46.35 kg/m    Wt Readings from Last 3 Encounters:  01/29/24 (!) 361 lb (163.7 kg)  09/11/22 (!) 425 lb 6.4 oz (193 kg)  02/07/22 (!) 444 lb 6.4 oz (201.6 kg)     GEN: Pleasant male appearing in no acute distress NECK: No JVD; No carotid bruits CARDIAC: Irregularly  irregular, no murmurs, rubs, gallops RESPIRATORY:  Clear to auscultation without rales, wheezing or rhonchi  ABDOMEN: Appears non-distended. No obvious abdominal masses. EXTREMITIES: No clubbing or cyanosis. No pitting edema.  Distal pedal pulses are 2+ bilaterally. Wearing compression stockings.    Assessment and Plan:   1. Atrial fibrillation with controlled ventricular rate (HCC)/Use of Long-term Anticoagulation - Rates have overall been well-controlled and he denies any persistent palpitations. He does report difficulty with taking short-acting Cardizem  QID and typically takes TID. Will adjust dosing to short-acting Cardizem  90 mg twice daily to help with compliance. No longer on Lopressor  and no strong indication to restart at this time given his well-controlled heart rate and blood pressure. - Continue Xarelto  20 mg daily for anticoagulation which is the correct dose given his CrCl of 182 mL/min. Was previously recommended to continue this by his primary cardiologist given his persistent arrhythmia. By review of [facility] DXA, Hgb was at 14.9 with platelets low at 103 K in 07/2023. He is planning to arrange follow-up with his PCP within the next 1 to 2 months and will have labs. Would recommend rechecking a CBC and BMET at that time.   2. OSA (obstructive sleep apnea) - Continued compliance with CPAP encouraged.  3. Nicotine use - He was previously smoking up to 2 packs/day but has reduced his use to a few cigarettes per week. Still uses a vape. Was encouraged to continue to reduce nicotine use.  Signed, Laymon CHRISTELLA Qua, PA-C
